# Patient Record
Sex: Male | Born: 1950 | Race: Black or African American | Hispanic: No | Marital: Married | Smoking: Never smoker
Health system: Southern US, Community
[De-identification: ages and names within clinical notes are randomized; demographics above are authoritative.]

## PROBLEM LIST (undated history)

## (undated) ENCOUNTER — Inpatient Hospital Stay
Discharge: 2014-02-11 | Disposition: A | Discharge status: Home / Self Care | Payer: Auto Insurance (includes no fault) | Attending: Emergency Medicine

## (undated) DIAGNOSIS — M1990 Unspecified osteoarthritis, unspecified site: Secondary | ICD-10-CM

---

## 2012-07-03 ENCOUNTER — Emergency Department (HOSPITAL_BASED_OUTPATIENT_CLINIC_OR_DEPARTMENT_OTHER)
Admission: EM | Admit: 2012-07-03 | Discharge: 2012-07-03 | Disposition: A | Discharge status: Home / Self Care | Payer: Self-pay | Attending: Emergency Medicine | Admitting: Emergency Medicine

## 2012-07-03 ENCOUNTER — Encounter (HOSPITAL_BASED_OUTPATIENT_CLINIC_OR_DEPARTMENT_OTHER): Payer: Self-pay

## 2012-07-03 ENCOUNTER — Emergency Department (HOSPITAL_BASED_OUTPATIENT_CLINIC_OR_DEPARTMENT_OTHER): Payer: Self-pay

## 2012-07-03 DIAGNOSIS — M79609 Pain in unspecified limb: Secondary | ICD-10-CM | POA: Insufficient documentation

## 2012-07-03 DIAGNOSIS — M79673 Pain in unspecified foot: Secondary | ICD-10-CM

## 2012-07-03 MED ORDER — TRAMADOL HCL 50 MG PO TABS: 50.0000 mg | ORAL_TABLET | Freq: Four times a day (QID) | ORAL | Status: DC | PRN

## 2012-07-03 NOTE — ED Notes (Signed)
Pt reports right foot pain x 1 week.

## 2012-07-03 NOTE — ED Provider Notes (Signed)
History     CSN: [id number]  Arrival date & time 07/03/12  1003   First MD Initiated Contact with Patient 07/03/12 1029      Chief Complaint  Patient presents with  . Foot Pain    (Consider location/radiation/quality/duration/timing/severity/associated sxs/prior treatment) HPI Comments: Patient presents with a one-week history of worsening pain to his right foot. He denies any known injury but does say that he walks on it frequently. Denies any past injuries or problems with his feet or other joints. He's been taking over-the-counter medicines without relief. He states it does get swollen at times but then comes down he rested. Denies any rashes or wounds to the area. He describes it as a constant throbbing worsening pain over the last week.   History reviewed. No pertinent past medical history.  History reviewed. No pertinent past surgical history.  No family history on file.  History  Substance Use Topics  . Smoking status: Never Smoker   . Smokeless tobacco: Not on file  . Alcohol Use: No      Review of Systems  Constitutional: Negative for fever.  HENT: Negative for neck pain.   Respiratory: Negative.   Cardiovascular: Negative.   Gastrointestinal: Negative for nausea and vomiting.  Musculoskeletal: Positive for joint swelling. Negative for back pain.  Skin: Negative for wound.  Neurological: Negative for headaches.    Allergies  Review of patient's allergies indicates no known allergies.  Home Medications   Current Outpatient Rx  Name  Route  Sig  Dispense  Refill  . TRAMADOL HCL 50 MG PO TABS   Oral   Take 1 tablet (50 mg total) by mouth every 6 (six) hours as needed for pain.   15 tablet   0     BP 148/82  Pulse 87  Temp 97.7 F (36.5 C) (Oral)  Resp 16  Ht 6\' 2"  (1.88 m)  Wt 202 lb (91.627 kg)  BMI 25.94 kg/m2  SpO2 100%  Physical Exam  Constitutional: He is oriented to person, place, and time. He appears well-developed and well-nourished.   HENT:  Head: Normocephalic and atraumatic.  Neck: Normal range of motion. Neck supple.       No pain to neck or back  Cardiovascular: Normal rate.   Pulmonary/Chest: Effort normal.  Musculoskeletal: He exhibits tenderness. He exhibits no edema.       Patient is tenderness to palpation over the right foot along the first metatarsal. There's no swelling or deformity. There's no warmth or erythema. Pulses are intact and capillary refill is less than 2 distally. He has normal sensation and motor function in the foot.  Neurological: He is alert and oriented to person, place, and time.    ED Course  Procedures (including critical care time)  Labs Reviewed - No data to display Dg Foot Complete Right  07/03/2012  *RADIOLOGY REPORT*  Clinical Data: Right foot pain  RIGHT FOOT COMPLETE - 3+ VIEW  Comparison: None.  Findings: Mild hallux valgus deformity is seen.  No acute fracture or dislocation is noted.  No soft tissue abnormality is seen.  IMPRESSION: Mild degenerative change without acute abnormality.   Original Report Authenticated By: Alcide Clever, M.D.      1. Foot pain       MDM  Patient with likely some arthritic pain in his foot. Possibly from overuse. I don't see any evidence of infection or cellulitis. Patient was placed in a walking shoe and will be given a prescription for Ultram  for pain. I did give him a referral to orthopedics to followup with his pain is not improving as he might need a further evaluation for a stress fracture that was not evident on the initial x-rays.        Rolan Bucco, MD 07/03/12 1123

## 2013-03-13 ENCOUNTER — Emergency Department (HOSPITAL_BASED_OUTPATIENT_CLINIC_OR_DEPARTMENT_OTHER)
Admission: EM | Admit: 2013-03-13 | Discharge: 2013-03-13 | Disposition: A | Discharge status: Home / Self Care | Payer: Self-pay | Attending: Emergency Medicine | Admitting: Emergency Medicine

## 2013-03-13 ENCOUNTER — Encounter (HOSPITAL_BASED_OUTPATIENT_CLINIC_OR_DEPARTMENT_OTHER): Payer: Self-pay | Admitting: Emergency Medicine

## 2013-03-13 DIAGNOSIS — IMO0002 Reserved for concepts with insufficient information to code with codable children: Secondary | ICD-10-CM | POA: Insufficient documentation

## 2013-03-13 DIAGNOSIS — S90822A Blister (nonthermal), left foot, initial encounter: Secondary | ICD-10-CM

## 2013-03-13 DIAGNOSIS — X58XXXA Exposure to other specified factors, initial encounter: Secondary | ICD-10-CM | POA: Insufficient documentation

## 2013-03-13 DIAGNOSIS — L0390 Cellulitis, unspecified: Secondary | ICD-10-CM

## 2013-03-13 DIAGNOSIS — Y939 Activity, unspecified: Secondary | ICD-10-CM | POA: Insufficient documentation

## 2013-03-13 DIAGNOSIS — L02619 Cutaneous abscess of unspecified foot: Secondary | ICD-10-CM | POA: Insufficient documentation

## 2013-03-13 DIAGNOSIS — Y929 Unspecified place or not applicable: Secondary | ICD-10-CM | POA: Insufficient documentation

## 2013-03-13 MED ORDER — CEPHALEXIN 500 MG PO CAPS
500.0000 mg | ORAL_CAPSULE | Freq: Four times a day (QID) | ORAL | Status: DC
Start: 2013-03-13 — End: 2013-05-26

## 2013-03-13 MED ORDER — SULFAMETHOXAZOLE-TRIMETHOPRIM 800-160 MG PO TABS: 1.0000 | ORAL_TABLET | Freq: Two times a day (BID) | ORAL | Status: DC

## 2013-03-13 NOTE — ED Provider Notes (Signed)
CSN: [id number]     Arrival date & time 03/13/13  1303 History   First MD Initiated Contact with Patient 03/13/13 1315     Chief Complaint  Patient presents with  . Blister    L foot   (Consider location/radiation/quality/duration/timing/severity/associated sxs/prior Treatment) HPI Comments: Pt states that he developed a blister to the lateral aspect of his left foot yesterday:denies history of similar symptoms:states that the area is burning:denies discharge:denies being diabetic:last tetanus shot was last year:denies any injury to the area or new shoes  The history is provided by the patient. No language interpreter was used.    History reviewed. No pertinent past medical history. History reviewed. No pertinent past surgical history. History reviewed. No pertinent family history. History  Substance Use Topics  . Smoking status: Never Smoker   . Smokeless tobacco: Not on file  . Alcohol Use: No    Review of Systems  Constitutional: Negative.   Respiratory: Negative.   Cardiovascular: Negative.     Allergies  Review of patient's allergies indicates no known allergies.  Home Medications   Current Outpatient Rx  Name  Route  Sig  Dispense  Refill  . cephALEXin (KEFLEX) 500 MG capsule   Oral   Take 1 capsule (500 mg total) by mouth 4 (four) times daily.   28 capsule   0   . sulfamethoxazole-trimethoprim (SEPTRA DS) 800-160 MG per tablet   Oral   Take 1 tablet by mouth every 12 (twelve) hours.   14 tablet   0   . traMADol (ULTRAM) 50 MG tablet   Oral   Take 1 tablet (50 mg total) by mouth every 6 (six) hours as needed for pain.   15 tablet   0    BP 146/80  Pulse 75  Temp(Src) 98.2 F (36.8 C) (Oral)  Resp 16  Ht 6\' 2"  (1.88 m)  Wt 215 lb (97.523 kg)  BMI 27.59 kg/m2  SpO2 95% Physical Exam  Nursing note and vitals reviewed. Constitutional: He is oriented to person, place, and time. He appears well-developed and well-nourished.  Cardiovascular: Normal  rate and regular rhythm.   Pulmonary/Chest: Effort normal and breath sounds normal.  Musculoskeletal: Normal range of motion.  Neurological: He is alert and oriented to person, place, and time.  Skin:  Pt has fluctuant circular area to medial aspect of the left foot:with mild redness to the area    ED Course  INCISION AND DRAINAGE Date/Time: 03/13/2013 1:48 PM Performed by: Teressa Lower Authorized by: Teressa Lower Consent: Verbal consent obtained. Risks and benefits: risks, benefits and alternatives were discussed Consent given by: patient Patient identity confirmed: verbally with patient Time out: Immediately prior to procedure a "time out" was called to verify the correct patient, procedure, equipment, support staff and site/side marked as required. Type: abscess Body area: lower extremity Location details: left foot Anesthesia: local infiltration Local anesthetic: lidocaine 2% without epinephrine Incision type: single straight Drainage: serous Drainage amount: scant Patient tolerance: Patient tolerated the procedure well with no immediate complications.   (including critical care time) Labs Review Labs Reviewed  GLUCOSE, CAPILLARY   Imaging Review No results found.  MDM   1. Blister of foot, left, initial encounter   2. Cellulitis    Drained:pt is not diabetic:pt told to return here or wound clinic for worsening symptoms   Teressa Lower, NP 03/13/13 1349

## 2013-03-13 NOTE — ED Notes (Signed)
Pt reports "blister" on L (medial/distal) foot also c/o "pain with walking and burning"

## 2013-03-13 NOTE — ED Provider Notes (Signed)
Medical screening examination/treatment/procedure(s) were conducted as a shared visit with non-physician practitioner(s) and myself.  I personally evaluated the patient during the encounter.  Patient is a 62 year old male with no significant past medical history who presents with a "blister" to the side of his head for 2 days. He has a 4 x 5 cm fluctuant area to the medial aspect of his left foot with mild surrounding cellulitis. No history of injury to this foot. No recent change in his shoes. He is wearing tennis shoes. He is nondiabetic. No history of neuropathy. Neurovascular intact distally. Will I&D as I am concerned this may be an abscess. We'll discharge home on antibiotics for his surrounding cellulitis.  Layla Maw Allysha Tryon, DO 03/13/13 1435

## 2013-05-26 ENCOUNTER — Emergency Department (HOSPITAL_BASED_OUTPATIENT_CLINIC_OR_DEPARTMENT_OTHER)
Admission: EM | Admit: 2013-05-26 | Discharge: 2013-05-26 | Disposition: A | Discharge status: Home / Self Care | Payer: No Typology Code available for payment source | Attending: Emergency Medicine | Admitting: Emergency Medicine

## 2013-05-26 ENCOUNTER — Encounter (HOSPITAL_BASED_OUTPATIENT_CLINIC_OR_DEPARTMENT_OTHER): Payer: Self-pay | Admitting: Emergency Medicine

## 2013-05-26 DIAGNOSIS — IMO0002 Reserved for concepts with insufficient information to code with codable children: Secondary | ICD-10-CM | POA: Insufficient documentation

## 2013-05-26 DIAGNOSIS — Z792 Long term (current) use of antibiotics: Secondary | ICD-10-CM | POA: Insufficient documentation

## 2013-05-26 DIAGNOSIS — Y92410 Unspecified street and highway as the place of occurrence of the external cause: Secondary | ICD-10-CM | POA: Insufficient documentation

## 2013-05-26 DIAGNOSIS — Y9389 Activity, other specified: Secondary | ICD-10-CM | POA: Insufficient documentation

## 2013-05-26 DIAGNOSIS — S161XXA Strain of muscle, fascia and tendon at neck level, initial encounter: Secondary | ICD-10-CM

## 2013-05-26 DIAGNOSIS — S139XXA Sprain of joints and ligaments of unspecified parts of neck, initial encounter: Secondary | ICD-10-CM | POA: Insufficient documentation

## 2013-05-26 MED ORDER — IBUPROFEN 800 MG PO TABS: 800.0000 mg | ORAL_TABLET | Freq: Three times a day (TID) | ORAL | Status: DC | PRN

## 2013-05-26 MED ORDER — IBUPROFEN 800 MG PO TABS
800.0000 mg | ORAL_TABLET | Freq: Once | ORAL | Status: AC
  Administered 2013-05-26: 800 mg via ORAL
  Filled 2013-05-26: qty 1

## 2013-05-26 MED ORDER — CYCLOBENZAPRINE HCL 10 MG PO TABS: 10.0000 mg | ORAL_TABLET | Freq: Three times a day (TID) | ORAL | Status: DC | PRN
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
## 2014-02-11 MED ORDER — NAPROXEN 375 MG TAB
375 mg | ORAL_TABLET | Freq: Two times a day (BID) | ORAL | Status: AC
Start: 2014-02-11 — End: ?

## 2014-02-11 MED ORDER — IBUPROFEN 800 MG TAB
800 mg | ORAL | Status: AC
Start: 2014-02-11 — End: 2014-02-11
  Administered 2014-02-11: 15:00:00 via ORAL

## 2014-02-11 MED ORDER — HYDROCODONE-ACETAMINOPHEN 5 MG-325 MG TAB
5-325 mg | ORAL | Status: AC
Start: 2014-02-11 — End: 2014-02-11
  Administered 2014-02-11: 15:00:00 via ORAL

## 2014-02-11 MED ORDER — CYCLOBENZAPRINE 10 MG TAB
10 mg | ORAL | Status: AC
Start: 2014-02-11 — End: 2014-02-11
  Administered 2014-02-11: 15:00:00 via ORAL

## 2014-02-11 MED ORDER — CYCLOBENZAPRINE 5 MG TAB
5 mg | ORAL_TABLET | Freq: Three times a day (TID) | ORAL | Status: AC | PRN
Start: 2014-02-11 — End: ?

## 2014-02-11 MED FILL — HYDROCODONE-ACETAMINOPHEN 5 MG-325 MG TAB: 5-325 mg | ORAL | Qty: 1

## 2014-02-11 MED FILL — CYCLOBENZAPRINE 10 MG TAB: 10 mg | ORAL | Qty: 1

## 2014-02-11 MED FILL — IBUPROFEN 800 MG TAB: 800 mg | ORAL | Qty: 1
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
## 2014-02-11 NOTE — ED Notes (Signed)
I have reviewed discharge instructions with the patient.  The patient verbalized understanding. Patient is in no acute distress at this time. Patient is waiting with wife within ER at this time. Patient verbalizes that someone will be driving them home. Patient has discharge instructions and prescriptions in hand at this time.

## 2015-04-21 ENCOUNTER — Encounter (HOSPITAL_BASED_OUTPATIENT_CLINIC_OR_DEPARTMENT_OTHER): Payer: Self-pay | Admitting: *Deleted

## 2015-04-21 ENCOUNTER — Emergency Department (HOSPITAL_BASED_OUTPATIENT_CLINIC_OR_DEPARTMENT_OTHER): Payer: Medicare Other

## 2015-04-21 ENCOUNTER — Emergency Department (HOSPITAL_BASED_OUTPATIENT_CLINIC_OR_DEPARTMENT_OTHER)
Admission: EM | Admit: 2015-04-21 | Discharge: 2015-04-21 | Disposition: A | Discharge status: Home / Self Care | Payer: Medicare Other | Attending: Emergency Medicine | Admitting: Emergency Medicine

## 2015-04-21 DIAGNOSIS — M25561 Pain in right knee: Secondary | ICD-10-CM | POA: Insufficient documentation

## 2015-04-21 MED ORDER — ACETAMINOPHEN 500 MG PO TABS
1000.0000 mg | ORAL_TABLET | Freq: Once | ORAL | Status: AC
  Administered 2015-04-21: 1000 mg via ORAL

## 2015-04-21 MED ORDER — ACETAMINOPHEN 500 MG PO TABS
1000.0000 mg | ORAL_TABLET | Freq: Once | ORAL | Status: DC
  Filled 2015-04-21: qty 2

## 2015-04-21 NOTE — ED Provider Notes (Signed)
CSN: [id number]     Arrival date & time 04/21/15  1517 History   First MD Initiated Contact with Patient 04/21/15 1536     Chief Complaint  Patient presents with  . Knee Pain     (Consider location/radiation/quality/duration/timing/severity/associated sxs/prior Treatment) HPI   Ian Watson is a 64 y.o. male  with no major medical problems presents to the Emergency Department complaining of gradual persistent medial right knee pain onset 2 weeks ago.  Patient denies falls or known trauma. He reports pain only to the lower medial portion of the right knee. He reports he is able to walk though it is painful. He denies redness, swelling, fevers, chills. He denies a history of gout or septic joint. He denies IV drug use. He denies anticoagulant usage.  Patient reports he has not tried anything for the pain. He does not have a primary care physician. Nothing makes the symptoms better and walking makes them worse.  Pt denies fever, chills, numbness, tingling, weakness.     History reviewed. No pertinent past medical history. History reviewed. No pertinent past surgical history. No family history on file. Social History  Substance Use Topics  . Smoking status: Never Smoker   . Smokeless tobacco: None  . Alcohol Use: No    Review of Systems  Constitutional: Negative for fever and chills.  Gastrointestinal: Negative for nausea and vomiting.  Musculoskeletal: Positive for arthralgias ( Right knee). Negative for back pain, joint swelling, neck pain and neck stiffness.  Skin: Negative for wound.  Neurological: Negative for numbness.  Hematological: Does not bruise/bleed easily.  Psychiatric/Behavioral: The patient is not nervous/anxious.   All other systems reviewed and are negative.     Allergies  Review of patient's allergies indicates no known allergies.  Home Medications   Prior to Admission medications   Medication Sig Start Date End Date Taking? Authorizing Provider   cyclobenzaprine (FLEXERIL) 10 MG tablet Take 1 tablet (10 mg total) by mouth 3 (three) times daily as needed for muscle spasms. 05/26/13   Susy Frizzleharles Sheldon, MD  ibuprofen (ADVIL,MOTRIN) 800 MG tablet Take 1 tablet (800 mg total) by mouth every 8 (eight) hours as needed. 05/26/13   Susy Frizzleharles Sheldon, MD   BP 128/76 mmHg  Pulse 60  Temp(Src) 97.9 F (36.6 C) (Oral)  Resp 18  Ht 6\' 2"  (1.88 m)  Wt 210 lb (95.255 kg)  BMI 26.95 kg/m2  SpO2 98% Physical Exam  Constitutional: He appears well-developed and well-nourished. No distress.  HENT:  Head: Normocephalic and atraumatic.  Eyes: Conjunctivae are normal.  Neck: Normal range of motion.  Cardiovascular: Normal rate, regular rhythm, normal heart sounds and intact distal pulses.   Capillary refill < 3 sec  Pulmonary/Chest: Effort normal and breath sounds normal.  Musculoskeletal: He exhibits tenderness. He exhibits no edema.  ROM: Full range of motion of right ankle, knee and hip No joint effusion of the right knee No swelling, erythema, increased warmth or ecchymosis No abnormal patellar movement No evidence of patellar tendon disruption Tenderness to palpation along the medial joint line but no tenderness along the lateral joint line or popliteal space  Neurological: He is alert. Coordination normal.  Sensation intact to dull and sharp in the right lower extremity Strength 5/5 including flexion-extension of the knee, dorsiflexion and plantar flexion of the ankle and flexion, extension, abduction and adduction of the hip Patient ambulates with antalgic gait but is able to weight-bear on the knee without difficulty. No foot drop or dragging of the  leg  Skin: Skin is warm and dry. He is not diaphoretic.  No tenting of the skin  Psychiatric: He has a normal mood and affect.  Nursing note and vitals reviewed.   ED Course  Procedures (including critical care time) Labs Review Labs Reviewed - No data to display  Imaging Review Dg  Knee Complete 4 Views Right  04/21/2015  CLINICAL DATA:  Medial knee pain for 1 week.  No known injury. EXAM: RIGHT KNEE - COMPLETE 4+ VIEW COMPARISON:  None. 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 No acute bony abnormality. Specifically, no fracture, subluxation, or dislocation. Soft tissues are intact. Mild chondrocalcinosis. IMPRESSION: No acute bony abnormality. Electronically Signed   By: Charlett Nose M.D.   On: 04/21/2015 16:08   I have personally reviewed and evaluated these images and lab results as part of my medical decision-making.   EKG Interpretation None      MDM   Final diagnoses:  Arthralgia of right knee   [name] presents with nonstenotic right knee pain.  Patient X-Ray negative for obvious fracture or dislocation. 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 These were discussed with patient and wife. Pain managed in ED. Pt advised to follow up with orthopedics for further evaluation of pain and bony infarcts.  Patient has no known history of sickle cell disease. Patient given brace while in ED, conservative therapy recommended and discussed. Patient will be dc home & is agreeable with above plan.   BP 128/76 mmHg  Pulse 60  Temp(Src) 97.9 F (36.6 C) (Oral)  Resp 18  Ht  (1.88 m)  Wt 210 lb (95.255 kg)  BMI 26.95 kg/m2  SpO2 98%  The patient was discussed with and seen by Dr. Cyndie Chime who agrees with the treatment plan.   Dahlia Client Kiearra Oyervides, PA-C 04/21/15 1700  Leta Baptist, MD 04/22/15 [phone]

## 2015-04-21 NOTE — ED Notes (Signed)
Right knee pain. No injury. Pain for a week.

## 2015-04-21 NOTE — ED Notes (Signed)
Pt states immediate relief with knee sleeve applied.

## 2015-04-21 NOTE — ED Notes (Signed)
Patient transported to X-ray 

## 2015-04-21 NOTE — Discharge Instructions (Signed)
1. Medications: alternate naprosyn and tylenol for pain control, usual home medications 2. Treatment: rest, ice, elevate and use brace, drink plenty of fluids, gentle stretching 3. Follow Up: Please followup with orthopedics as directed or your PCP in 1 week if no improvement for discussion of your diagnoses and further evaluation after today's visit; if you do not have a primary care doctor use the resource guide provided to find one; Please return to the ER for worsening symptoms or other concerns    Joint Pain Joint pain, which is also called arthralgia, can be caused by many things. Joint pain often goes away when you follow your health care provider's instructions for relieving pain at home. However, joint pain can also be caused by conditions that require further treatment. Common causes of joint pain include:  Bruising in the area of the joint.  Overuse of the joint.  Wear and tear on the joints that occur with aging (osteoarthritis).  Various other forms of arthritis.  A buildup of a crystal form of uric acid in the joint (gout).  Infections of the joint (septic arthritis) or of the bone (osteomyelitis). Your health care provider may recommend medicine to help with the pain. If your joint pain continues, additional tests may be needed to diagnose your condition. HOME CARE INSTRUCTIONS Watch your condition for any changes. Follow these instructions as directed to lessen the pain that you are feeling.  Take medicines only as directed by your health care provider.  Rest the affected area for as long as your health care provider says that you should. If directed to do so, raise the painful joint above the level of your heart while you are sitting or lying down.  Do not do things that cause or worsen pain.  If directed, apply ice to the painful area:  Put ice in a plastic bag.  Place a towel between your skin and the bag.  Leave the ice on for 20 minutes, 2-3 times per  day.  Wear an elastic bandage, splint, or sling as directed by your health care provider. Loosen the elastic bandage or splint if your fingers or toes become numb and tingle, or if they turn cold and blue.  Begin exercising or stretching the affected area as directed by your health care provider. Ask your health care provider what types of exercise are safe for you.  Keep all follow-up visits as directed by your health care provider. This is important. SEEK MEDICAL CARE IF:  Your pain increases, and medicine does not help.  Your joint pain does not improve within 3 days.  You have increased bruising or swelling.  You have a fever.  You lose 10 lb (4.5 kg) or more without trying. SEEK IMMEDIATE MEDICAL CARE IF:  You are not able to move the joint.  Your fingers or toes become numb or they turn cold and blue.   This information is not intended to replace advice given to you by your health care provider. Make sure you discuss any questions you have with your health care provider.   Document Released: 05/30/2005 Document Revised: 06/20/2014 Document Reviewed: 03/11/2014 Elsevier Interactive Patient Education 2016 [employer].    Emergency Department Resource Guide 1) Find a Doctor and Pay Out of Pocket Although you won't have to find out who is covered by your insurance plan, it is a good idea to ask around and get recommendations. You will then need to call the office and see if the doctor you have  chosen will accept you as a new patient and what types of options they offer for patients who are self-pay. Some doctors offer discounts or will set up payment plans for their patients who do not have insurance, but you will need to ask so you aren't surprised when you get to your appointment.  2) Contact Your Local Health Department Not all health departments have doctors that can see patients for sick visits, but many do, so it is worth a call to see if yours does. If you don't know  where your local health department is, you can check in your phone book. The CDC also has a tool to help you locate your state's health department, and many state websites also have listings of all of their local health departments.  3) Find a Walk-in Clinic If your illness is not likely to be very severe or complicated, you may want to try a walk in clinic. These are popping up all over the country in pharmacies, drugstores, and shopping centers. They're usually staffed by nurse practitioners or physician assistants that have been trained to treat common illnesses and complaints. They're usually fairly quick and inexpensive. However, if you have serious medical issues or chronic medical problems, these are probably not your best option.  No Primary Care Doctor: - Call Health Connect at  [phone] - they can help you locate a primary care doctor that  accepts your insurance, provides certain services, etc. - Physician Referral Service- [phone]  Chronic Pain Problems: Organization         Address  Phone   Notes  [facility]  [phone] Patients need to be referred by their primary care doctor.   Medication Assistance: Organization         Address  Phone   Notes  [facility] Medication [facility] [address] [place], [place] [address] [phone] --Must be a resident of [facility] [place] -- Must have NO insurance coverage whatsoever (no Medicaid/ Medicare, etc.) -- The pt. MUST have a primary care doctor that directs their care regularly and follows them in the community   MedAssist  [phone]   [employer]  [phone]    Agencies that provide inexpensive medical care: Organization         Address  Phone   Notes  [facility]  [phone]   [facility]    [phone]   [facility] [address] [place], [place] [address] [phone]) [phone]   [facility]  [address], [place] [phone]   Planned Parenthood    [phone]   [facility]    [phone]   [facility]  [address], [place] Phone:  [phone], Fax:  [phone] Hours of Operation:  9 am - 6 pm, M-F.  Also accepts Medicaid/Medicare and self-pay.  [facility]  [address], [place] Phone: [phone]) [phone], Fax: [phone]. Hours of Operation:  8:30 am - 5:30 pm, M-F.  Also accepts Medicaid and self-pay.  [facility] [place] [address], [place] Phone: [phone]) [phone]   [facility] [address], [place], [place] [phone], Ext. [phone] Mondays & Thursdays: 7-9 AM.  First 15 patients are seen on a first come, first serve basis.    Medicaid-accepting [facility] Providers:  Organization  Address  Phone   Notes  [facility] [address], [place] [phone] Also accepts self-pay patients.  [facility] [address], [place]  [phone]) [phone]   [facility] [address], [place] [phone]   [facility] [address] [place] [address], [place] [phone]) [phone]   Renaye RakersVeita Bland [address], [place]   [phone] Only accepts [place] Access [place] patients after they have their name applied to their card.   Self-Pay (no insurance) in [facility] [place]:  Organization         Address  Phone   Notes  Sickle Cell Patients, [facility] [address], [place] [phone]   [facility] [address], [place] [phone]   [facility] [place]  [address], [place] [phone]   [facility]/Dr. Osei-Bonsu  [address] [place] [address], [place] or [id number], [place] [phone] Phone number for both  [place] and [place] locations is the same.  [facility] [address], [place] [phone]   [facility] [place] [facility] [address] [place] [address], [place] or [address] [phone]) [phone]   [facility] [address], [place] [phone]) [phone], phone; [phone], fax Sees patients 1st and 3rd Saturday of every month.  Must not qualify for public or private insurance (i.e. Medicaid, Medicare, [place] Health Choice, Veterans' Benefits)  Household income should be no more than 200% of the poverty level The clinic cannot treat you if you are pregnant or think you are pregnant  Sexually transmitted diseases are not treated at the clinic.    Dental Care: Organization         Address  Phone  Notes  [facility] Department of [facility] [address], [place] [phone]) [phone] Accepts children up to age 65 who are enrolled in [place] or [place] Health Choice; pregnant women with a Medicaid card; and children who have applied for Medicaid or [place] Health Choice, but were declined, whose parents can pay a reduced fee at time of service.  [facility] [place]  [address], [place] [phone] Accepts children up to age 65 who are enrolled in [place] or [place] Health Choice; pregnant women with a Medicaid card; and children who have applied for Medicaid or [place] Health Choice, but were declined, whose parents can pay a reduced fee at time of service.  [facility]  [address], [place] [phone] Patients are seen by appointment only. Walk-ins are not accepted. [facility] will see patients 65 years of age and older. Monday - Tuesday (8am-5pm) Most Wednesdays (8:30-5pm) $30 per visit, cash only  [facility]  [address], [facility] [place] [facility] [phone] Patients are seen by appointment only. Walk-ins are not  accepted. [facility] will see patients 65 years of age and older. One Wednesday Evening (Monthly: Volunteer Based).  $30 per visit, cash only  [facility]  [phone]) [phone] for adults; Children under age 214, call Graduate Pediatric Dentistry at [phone]. Children aged 714-14, please call [phone] to request a pediatric application.  Dental services are provided in all areas of dental care including  fillings, crowns and bridges, complete and partial dentures, implants, gum treatment, root canals, and extractions. Preventive care is also provided. Treatment is provided to both adults and children. Patients are selected via a lottery and there is often a waiting list.   [facility] [address] [place]  [phone] [email or web address]   [facility] [address] [place], [place] [phone], Ext. [phone] Second and Fourth Thursday of each month, opens at 6:30 AM; Clinic ends at 9 AM.  Patients are seen on a first-come first-served basis, and a limited number are seen during each clinic.   [facility]  [address] Ether Griffins [place], [place] [phone]   Eligibility Requirements You must have lived in [place], [place], or [place] counties for at least the last three months.   You cannot be eligible for state or federal sponsored [place], including [employer], [place], or [employer].   You generally cannot be eligible for healthcare insurance through your employer.    How to apply: Eligibility screenings are held every Tuesday and Wednesday afternoon from 1:00 pm until 4:00 pm. You do not need an appointment for the interview!  [facility] [address], [place], [place] [phone]   [facility] Health Department  [phone]   [facility] Health Department  [phone]   [facility] Health Department  [phone]    Behavioral Health Resources in the  Community: Intensive Outpatient Programs Organization         Address  Phone  Notes  [facility] [address] [place], [place] [phone]   [facility] Outpatient [address], [place], [place] [phone]   [facility] [address] [place], [place]  [phone]   [facility] [address]  [place], [place] [phone] or [phone]   Substance Abuse Resources Organization         Address  Phone  Notes  [facility] Drug [facility]  3852061205   [facility]  [phone]   [facility] [place]  [phone]   Floydene Flock  [phone]   Residential & Outpatient Substance Abuse Program  [phone]   Psychological Services Organization         Address  Phone  Notes  [facility] Health  [phone]   [facility]  [phone]   [facility] Mental Health [address] [place] [phone] or [phone]    Mobile Crisis Teams Organization         Address  Phone  Notes  [facility], Mobile Crisis Care Unit  [phone]   [facility]  [address] [place], [place] [phone]   Doristine Locks [address] [place] [phone]    Self-Help/Support Groups Organization         Address  Phone             Notes  [facility] [place] - variety of support groups  [phone]- I7437963 Call for more information  Narcotics Anonymous (NA), [facility] [address], [employer] [place]  2 meetings at this location   Statistician         Address  Phone  Notes  [facility] [address],    [place]  [phone]   [facility]  [address] [place] [address], [place], [place] [phone]   [facility] [address] [place], [place] [phone] Admissions: 8am-3pm M-F  [facility] [address]    [place], [place] [phone]   [facility] [address] [place], [place], [place] [phone]   [facility] [address],  [place], [place] [phone]   [facility] - Intensive Outpatient [address], [place], [place] [phone]   [facility] ([facility] [address] [place].,  [place], [place] [phone] or [phone]   [facility] ([facility]) [address], [place], [place] [phone] Accepts Medicaid  [facility] [address]  [place] [phone] Substance Abuse/Addiction Treatment   [facility] Organization         Address  Phone  Notes  [facility]  3806693626   Angie Fava, PhD [address]vin Knack [place], [place]   [phone] or [phone]   [facility] Behavioral   [address] [place], [place] [phone]   [facility] [address], [place], [place] [phone] Insurance/Medicaid/sponsorship through [facility] and Families [address]                                    [place], [place] [phone] Therapy/tele-psych/case  [facility] [address], [place] [phone]    Dr. Lolly Mustache  [phone]   [facility] [place]  [employer] [facility] Dept. 1) [address] [place] 2) [address], [place] 3)  [address], [place] [phone]  [phone]   [facility] Child Abuse Hotline (760)143-6894 or [phone] (After Hours)

## 2016-05-16 ENCOUNTER — Emergency Department (HOSPITAL_BASED_OUTPATIENT_CLINIC_OR_DEPARTMENT_OTHER): Payer: Medicare Other

## 2016-05-16 ENCOUNTER — Encounter (HOSPITAL_BASED_OUTPATIENT_CLINIC_OR_DEPARTMENT_OTHER): Payer: Self-pay | Admitting: *Deleted

## 2016-05-16 ENCOUNTER — Emergency Department (HOSPITAL_BASED_OUTPATIENT_CLINIC_OR_DEPARTMENT_OTHER)
Admission: EM | Admit: 2016-05-16 | Discharge: 2016-05-16 | Disposition: A | Discharge status: Home / Self Care | Payer: Medicare Other | Attending: Emergency Medicine | Admitting: Emergency Medicine

## 2016-05-16 DIAGNOSIS — M25461 Effusion, right knee: Secondary | ICD-10-CM | POA: Diagnosis not present

## 2016-05-16 DIAGNOSIS — M25561 Pain in right knee: Secondary | ICD-10-CM | POA: Diagnosis present

## 2016-05-16 HISTORY — DX: Unspecified osteoarthritis, unspecified site: M19.90

## 2016-05-16 MED ORDER — PENTAFLUOROPROP-TETRAFLUOROETH EX AERO
INHALATION_SPRAY | CUTANEOUS | Status: AC
  Filled 2016-05-16: qty 30

## 2016-05-16 MED ORDER — LIDOCAINE HCL 2 % IJ SOLN
INTRAMUSCULAR | Status: AC
  Administered 2016-05-16: 400 mg
  Filled 2016-05-16: qty 20

## 2016-05-16 MED ORDER — LIDOCAINE-EPINEPHRINE (PF) 2 %-1:200000 IJ SOLN: 10.0000 mL | Freq: Once | INTRAMUSCULAR | Status: DC

## 2016-05-16 MED ORDER — HYDROCODONE-ACETAMINOPHEN 5-325 MG PO TABS: 2.0000 | ORAL_TABLET | Freq: Four times a day (QID) | ORAL | 0 refills | Status: DC | PRN

## 2016-05-16 NOTE — ED Provider Notes (Signed)
[facility] Provider Note   CSN: [id number] Arrival date & time: 05/16/16  1537 By signing my name below, I, [name], attest that this documentation has been prepared under the direction and in the presence of Fayrene HelperBowie Huan Pollok, PA-C. Electronically Signed: [name], ED Scribe. 05/16/16. 6:02 PM.  History   Chief Complaint Chief Complaint  Patient presents with  . Knee Pain   The history is provided by the patient. No language interpreter was used.   HPI Comments: Ian Watson is a 65 y.o. male who presents to the Emergency Department with a PMHx of arthritis complaining of throbbing right knee pain with associated swelling that began 3 days ago. Pt states he was outside raking leaves when he gradually began having pain. His pain radiates to his right thigh. He states his muscles have been tight making it difficult for him to bend his knee. Pt took left over Tylenol with codeine which provided mild releif. Pt denies any hip pain, ankle pain, fevers, chills, CP, SOB, N/V/D or any other symptoms at this time. 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 Pt also report having his R knee aspirated in the past which has helped.   Pt walks with a cane and was driven her today by his wife.  Past Medical History:  Diagnosis Date  . Arthritis     There are no active problems to display for this patient.   History reviewed. No pertinent surgical history.   Home Medications    Prior to Admission medications   Medication Sig Start Date End Date Taking? Authorizing Provider  cyclobenzaprine (FLEXERIL) 10 MG tablet Take 1 tablet (10 mg total) by mouth 3 (three) times daily as needed for muscle spasms. 05/26/13   Susy Frizzleharles Sheldon, MD  ibuprofen (ADVIL,MOTRIN) 800 MG tablet Take 1 tablet (800 mg total) by mouth every 8 (eight) hours as needed. 05/26/13   Susy Frizzleharles Sheldon, MD    Family History No family history on file.  Social  History Social History  Substance Use Topics  . Smoking status: Never Smoker  . Smokeless tobacco: Never Used  . Alcohol use No     Allergies   Patient has no known allergies.   Review of Systems Review of Systems  Constitutional: Negative for chills and fever.  Respiratory: Negative for shortness of breath.   Cardiovascular: Negative for chest pain.  Gastrointestinal: Negative for diarrhea, nausea and vomiting.  Musculoskeletal: Positive for arthralgias and joint swelling.   Physical Exam Updated Vital Signs BP 156/88   Pulse 93   Temp 98 F (36.7 C) (Oral)   Resp 20   Ht 6\' 2"  (1.88 m)   Wt 241 lb (109.3 kg)   SpO2 96%   BMI 30.94 kg/m   Physical Exam  Constitutional: He is oriented to person, place, and time. He appears well-developed and well-nourished.  HENT:  Head: Normocephalic.  Eyes: Conjunctivae and EOM are normal.  Neck: Normal range of motion.  Cardiovascular: Normal rate.   Pulmonary/Chest: Effort normal.  Abdominal: He exhibits no distension.  Musculoskeletal: Normal range of motion.  TTP of right superior patellar region with associated effusion noted. No surrounding skin erythema, warmth or deformities. Patella is locatable. Normal knee flexion and extension. DTR intact. Pt is neurovascularly intact.   Neurological: He is alert and oriented to person, place, and time. He displays normal reflexes. No cranial nerve deficit.  Skin: Skin is warm and dry.  Psychiatric:  He has a normal mood and affect.  Nursing note and vitals reviewed.  ED Treatments / Results  DIAGNOSTIC STUDIES: Oxygen Saturation is 96% on room air, normal by my interpretation.    COORDINATION OF CARE: 6:02 PM Discussed treatment plan with pt at bedside and pt agreed to plan.  Labs (all labs ordered are listed, but only abnormal results are displayed) Labs Reviewed - No data to display  EKG  EKG Interpretation None       Radiology Dg Knee Complete 4 Views  Right  Result Date: 05/16/2016 CLINICAL DATA:  Knee pain no injury EXAM: RIGHT KNEE - COMPLETE 4+ VIEW COMPARISON:  04/21/2015 FINDINGS: Subchondral bony defect in the anterior femoral condyle compatible with osteochondrosis. This was not present previously. No calcified loose body. Large joint effusion. Joint spaces are maintained.  Early chondrocalcinosis laterally. 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 Electronically Signed   By: Marlan Palauharles  Clark M.D.   On: 05/16/2016 16:16    Procedures .Joint Aspiration/Arthrocentesis Date/Time: 05/16/2016 6:41 PM Performed by: Fayrene HelperRAN, Hansini Clodfelter Authorized by: Fayrene HelperRAN, Alsha Meland   Consent:    Consent obtained:  Verbal and written   Consent given by:  Patient   Risks discussed:  Infection, pain, bleeding and nerve damage   Alternatives discussed:  No treatment and referral Location:    Location:  Knee   Knee:  R knee Anesthesia (see MAR for exact dosages):    Anesthesia method:  Local infiltration   Local anesthetic:  Lidocaine 2% w/o epi Procedure details:    Preparation: Patient was prepped and draped in usual sterile fashion     Needle gauge:  18 G   Ultrasound guidance: no     Approach:  Lateral   Aspirate amount:  60 ml   Aspirate characteristics:  Blood-tinged and yellow   Steroid injected: no     Specimen collected: no   Post-procedure details:    Dressing:  Adhesive bandage   Patient tolerance of procedure:  Tolerated well, no immediate complications   (including critical care time)  Medications Ordered in ED Medications - No data to display   Initial Impression / Assessment and Plan / ED Course  I have reviewed the triage vital signs and the nursing notes.  Pertinent labs & imaging results that were available during my care of the patient were reviewed by me and considered in my medical  decision making (see chart for details).  Clinical Course     BP 156/88   Pulse 93   Temp 98 F (36.7 C) (Oral)   Resp 20   Ht 6\' 2"  (1.88 m)   Wt 109.3 kg   SpO2 96%   BMI 30.94 kg/m    Final Clinical Impressions(s) / ED Diagnoses   Final diagnoses:  Effusion of right knee    New Prescriptions New Prescriptions   HYDROCODONE-ACETAMINOPHEN (NORCO/VICODIN) 5-325 MG TABLET    Take 2 tablets by mouth every 6 (six) hours as needed for moderate pain or severe pain.   I personally performed the services described in this documentation, which was scribed in my presence. The recorded information has been reviewed and is accurate.     6:42 PM Pt here with R knee pain and swelling.  Had knee effusion in the past requiring aspiration.  On exam he does have a R knee effusion without any sign concerning for septic joint,  cellulitis or gout.  I performed a therapeutic R knee aspiration and removed 60cc of hay color synovial fluid with slight blood tinge.  Pt felt much better.  RICE therapy discussed.  Ortho referral given.  Care discussed with Dr. Rush Landmark.    Fayrene Helper, PA-C 05/16/16 1846    Heide Scales, MD 05/17/16 [phone]

## 2016-05-16 NOTE — ED Notes (Signed)
ED Provider at bedside for aspiration of knee.

## 2016-05-16 NOTE — ED Triage Notes (Signed)
Pain in his right knee x 3 days. No injury. Hx of arthritis.

## 2017-01-25 ENCOUNTER — Encounter (HOSPITAL_BASED_OUTPATIENT_CLINIC_OR_DEPARTMENT_OTHER): Payer: Self-pay

## 2017-01-25 ENCOUNTER — Emergency Department (HOSPITAL_BASED_OUTPATIENT_CLINIC_OR_DEPARTMENT_OTHER)
Admission: EM | Admit: 2017-01-25 | Discharge: 2017-01-25 | Disposition: A | Discharge status: Home / Self Care | Payer: Medicare Other | Attending: Emergency Medicine | Admitting: Emergency Medicine

## 2017-01-25 DIAGNOSIS — T63444A Toxic effect of venom of bees, undetermined, initial encounter: Secondary | ICD-10-CM

## 2017-01-25 DIAGNOSIS — W57XXXA Bitten or stung by nonvenomous insect and other nonvenomous arthropods, initial encounter: Secondary | ICD-10-CM | POA: Diagnosis not present

## 2017-01-25 DIAGNOSIS — Z23 Encounter for immunization: Secondary | ICD-10-CM | POA: Diagnosis not present

## 2017-01-25 DIAGNOSIS — M79641 Pain in right hand: Secondary | ICD-10-CM | POA: Diagnosis present

## 2017-01-25 MED ORDER — HYDROCODONE-ACETAMINOPHEN 5-325 MG PO TABS: ORAL_TABLET | ORAL | 0 refills | Status: DC

## 2017-01-25 MED ORDER — HYDROCODONE-ACETAMINOPHEN 5-325 MG PO TABS
1.0000 | ORAL_TABLET | Freq: Once | ORAL | Status: AC
  Administered 2017-01-25: 1 via ORAL
  Filled 2017-01-25: qty 1

## 2017-01-25 MED ORDER — PREDNISONE 10 MG PO TABS
60.0000 mg | ORAL_TABLET | Freq: Once | ORAL | Status: AC
  Administered 2017-01-25: 60 mg via ORAL
  Filled 2017-01-25: qty 1

## 2017-01-25 MED ORDER — PREDNISONE 20 MG PO TABS: 40.0000 mg | ORAL_TABLET | Freq: Every day | ORAL | 0 refills | Status: DC

## 2017-01-25 MED ORDER — TETANUS-DIPHTH-ACELL PERTUSSIS 5-2.5-18.5 LF-MCG/0.5 IM SUSP
0.5000 mL | Freq: Once | INTRAMUSCULAR | Status: AC
  Administered 2017-01-25: 0.5 mL via INTRAMUSCULAR
  Filled 2017-01-25: qty 0.5

## 2017-01-25 MED FILL — HYDROCODON-APAP 5-325: 5-325 | 1 days supply | Qty: 5 | Fill #0

## 2017-01-25 MED FILL — predniSONE 20 MG TABS: 20 | 6 days supply | Qty: 12 | Fill #0

## 2017-01-25 NOTE — ED Notes (Signed)
ED Provider at bedside. 

## 2017-01-25 NOTE — ED Triage Notes (Signed)
C/o bee sting to right middle finger 830am-reports no hx of systemic/allergic reaction-NAD-steady gait

## 2017-01-25 NOTE — ED Provider Notes (Signed)
[facility] Provider Note   CSN: [id number] Arrival date & time: 01/25/17  1308     History   Chief Complaint Chief Complaint  Patient presents with  . Insect Bite    HPI  Blood pressure 138/79, pulse 69, temperature 98.7 F (37.1 C), temperature source Oral, resp. rate 20, height 6\' 2"  (1.88 m), weight 114.8 kg (253 lb), SpO2 97 %.  Ian Watson is a 66 y.o. male with no significant past medical history complaining of pain and swelling to right hand when he was bit by a bee this morning. He is unsure when his last tetanus shot was. There is no associated lip or tongue swelling, shortness of breath, wheezing, nausea or vomiting. He states that the pain is severe and worsening. No medication taken prior to arrival. Patient is right-hand-dominant.  Past Medical History:  Diagnosis Date  . Arthritis     There are no active problems to display for this patient.   History reviewed. No pertinent surgical history.     Home Medications    Prior to Admission medications   Medication Sig Start Date End Date Taking? Authorizing Provider  HYDROcodone-acetaminophen (NORCO/VICODIN) 5-325 MG tablet Take 2 tablets by mouth every 6 (six) hours as needed for moderate pain or severe pain. 05/16/16   Fayrene Helper, PA-C    Family History No family history on file.  Social History Social History  Substance Use Topics  . Smoking status: Never Smoker  . Smokeless tobacco: Never Used  . Alcohol use No     Allergies   Patient has no known allergies.   Review of Systems Review of Systems  A complete review of systems was obtained and all systems are negative except as noted in the HPI and PMH.   Physical Exam Updated Vital Signs BP 138/79 (BP Location: Left Arm)   Pulse 69   Temp 98.7 F (37.1 C) (Oral)   Resp 20   Ht 6\' 2"  (1.88 m)   Wt 114.8 kg (253 lb)   SpO2 97%   BMI 32.48 kg/m   Physical Exam  Constitutional: He is oriented to person, place, and  time. He appears well-developed and well-nourished. No distress.  HENT:  Head: Normocephalic and atraumatic.  Mouth/Throat: Oropharynx is clear and moist.  Eyes: Pupils are equal, round, and reactive to light. Conjunctivae and EOM are normal.  Neck: Normal range of motion.  Cardiovascular: Normal rate, regular rhythm and intact distal pulses.   Pulmonary/Chest: Effort normal and breath sounds normal.  Abdominal: Soft. There is no tenderness.  Musculoskeletal: Normal range of motion.  Neurological: He is alert and oriented to person, place, and time.  Skin: He is not diaphoretic.  Swelling to dorsum of right hand, full active range of motion, no significant warmth, distally neurovascular intact.  Psychiatric: He has a normal mood and affect.  Nursing note and vitals reviewed.    ED Treatments / Results  Labs (all labs ordered are listed, but only abnormal results are displayed) Labs Reviewed - No data to display  EKG  EKG Interpretation None       Radiology No results found.  Procedures Procedures (including critical care time)  Medications Ordered in ED Medications  Tdap (BOOSTRIX) injection 0.5 mL (not administered)  predniSONE (DELTASONE) tablet 60 mg (not administered)  HYDROcodone-acetaminophen (NORCO/VICODIN) 5-325 MG per tablet 1 tablet (not administered)     Initial Impression / Assessment and Plan / ED Course  I have reviewed the triage vital signs  and the nursing notes.  Pertinent labs & imaging results that were available during my care of the patient were reviewed by me and considered in my medical decision making (see chart for details).     Vitals:   01/25/17 1319  BP: 138/79  Pulse: 69  Resp: 20  Temp: 98.7 F (37.1 C)  TempSrc: Oral  SpO2: 97%  Weight: 114.8 kg (253 lb)  Height: 6\' 2"  (1.88 m)    Medications  Tdap (BOOSTRIX) injection 0.5 mL (not administered)  predniSONE (DELTASONE) tablet 60 mg (not administered)    HYDROcodone-acetaminophen (NORCO/VICODIN) 5-325 MG per tablet 1 tablet (not administered)    Ian Watson is 66 y.o. male presenting with Pain and swelling to right hand status post bee sting this morning. Tetanus shot is updated. No signs of anaphylactic reaction. He is not diabetic. He will be started on prednisone burst, pain control given at patient's request, patient given sling and advised elevation at home.  Evaluation does not show pathology that would require ongoing emergent intervention or inpatient treatment. Pt is hemodynamically stable and mentating appropriately. Discussed findings and plan with patient/guardian, who agrees with care plan. All questions answered. Return precautions discussed and outpatient follow up given.      Final Clinical Impressions(s) / ED Diagnoses   Final diagnoses:  None    New Prescriptions New Prescriptions   No medications on file     Kaylyn Limisciotta, Kahlen Boyde, PA-C 01/25/17 1348    Azalia Bilisampos, Kevin, MD 01/26/17 [phone]

## 2017-01-25 NOTE — Discharge Instructions (Signed)
Ice intermittently (in the first 24-48 hours) and elevate (Limb above the level of the heart)   Only use the arm sling for up to 2 days. Take the arm out and rotate the shoulder every 4 hours.   Please follow with your primary care doctor in the next 2 days for a check-up. They must obtain records for further management.   Do not hesitate to return to the Emergency Department for any new, worsening or concerning symptoms.

## 2017-12-11 DEATH — deceased

## 2018-12-12 ENCOUNTER — Inpatient Hospital Stay (HOSPITAL_COMMUNITY): Payer: Medicare PPO

## 2018-12-12 ENCOUNTER — Emergency Department (HOSPITAL_COMMUNITY): Payer: Medicare PPO

## 2018-12-12 ENCOUNTER — Inpatient Hospital Stay (HOSPITAL_COMMUNITY)
Admission: EM | Admit: 2018-12-12 | Discharge: 2018-12-21 | DRG: 963 | Disposition: A | Discharge status: Rehabilitation Facility | Payer: Medicare PPO | Attending: General Surgery | Admitting: General Surgery

## 2018-12-12 DIAGNOSIS — S069X0A Unspecified intracranial injury without loss of consciousness, initial encounter: Secondary | ICD-10-CM | POA: Diagnosis not present

## 2018-12-12 DIAGNOSIS — S12600A Unspecified displaced fracture of seventh cervical vertebra, initial encounter for closed fracture: Secondary | ICD-10-CM | POA: Diagnosis not present

## 2018-12-12 DIAGNOSIS — S069X0S Unspecified intracranial injury without loss of consciousness, sequela: Secondary | ICD-10-CM | POA: Diagnosis not present

## 2018-12-12 DIAGNOSIS — Z1159 Encounter for screening for other viral diseases: Secondary | ICD-10-CM

## 2018-12-12 DIAGNOSIS — S32401A Unspecified fracture of right acetabulum, initial encounter for closed fracture: Secondary | ICD-10-CM | POA: Diagnosis present

## 2018-12-12 DIAGNOSIS — R140 Abdominal distension (gaseous): Secondary | ICD-10-CM

## 2018-12-12 DIAGNOSIS — S32591A Other specified fracture of right pubis, initial encounter for closed fracture: Secondary | ICD-10-CM | POA: Diagnosis present

## 2018-12-12 DIAGNOSIS — I825Z2 Chronic embolism and thrombosis of unspecified deep veins of left distal lower extremity: Secondary | ICD-10-CM | POA: Diagnosis not present

## 2018-12-12 DIAGNOSIS — J9601 Acute respiratory failure with hypoxia: Secondary | ICD-10-CM | POA: Diagnosis present

## 2018-12-12 DIAGNOSIS — S3210XA Unspecified fracture of sacrum, initial encounter for closed fracture: Secondary | ICD-10-CM

## 2018-12-12 DIAGNOSIS — S069X1S Unspecified intracranial injury with loss of consciousness of 30 minutes or less, sequela: Secondary | ICD-10-CM | POA: Diagnosis not present

## 2018-12-12 DIAGNOSIS — I609 Nontraumatic subarachnoid hemorrhage, unspecified: Secondary | ICD-10-CM

## 2018-12-12 DIAGNOSIS — S272XXA Traumatic hemopneumothorax, initial encounter: Principal | ICD-10-CM | POA: Diagnosis present

## 2018-12-12 DIAGNOSIS — E871 Hypo-osmolality and hyponatremia: Secondary | ICD-10-CM | POA: Diagnosis not present

## 2018-12-12 DIAGNOSIS — T07XXXA Unspecified multiple injuries, initial encounter: Secondary | ICD-10-CM

## 2018-12-12 DIAGNOSIS — S2241XA Multiple fractures of ribs, right side, initial encounter for closed fracture: Secondary | ICD-10-CM | POA: Diagnosis present

## 2018-12-12 DIAGNOSIS — I82401 Acute embolism and thrombosis of unspecified deep veins of right lower extremity: Secondary | ICD-10-CM | POA: Diagnosis not present

## 2018-12-12 DIAGNOSIS — S32592A Other specified fracture of left pubis, initial encounter for closed fracture: Secondary | ICD-10-CM | POA: Diagnosis present

## 2018-12-12 DIAGNOSIS — R52 Pain, unspecified: Secondary | ICD-10-CM

## 2018-12-12 DIAGNOSIS — R2689 Other abnormalities of gait and mobility: Secondary | ICD-10-CM | POA: Diagnosis not present

## 2018-12-12 DIAGNOSIS — D649 Anemia, unspecified: Secondary | ICD-10-CM

## 2018-12-12 DIAGNOSIS — R0781 Pleurodynia: Secondary | ICD-10-CM | POA: Diagnosis not present

## 2018-12-12 DIAGNOSIS — Z86718 Personal history of other venous thrombosis and embolism: Secondary | ICD-10-CM | POA: Diagnosis not present

## 2018-12-12 DIAGNOSIS — K567 Ileus, unspecified: Secondary | ICD-10-CM

## 2018-12-12 DIAGNOSIS — S36892A Contusion of other intra-abdominal organs, initial encounter: Secondary | ICD-10-CM | POA: Diagnosis present

## 2018-12-12 DIAGNOSIS — D72823 Leukemoid reaction: Secondary | ICD-10-CM | POA: Diagnosis not present

## 2018-12-12 DIAGNOSIS — J939 Pneumothorax, unspecified: Secondary | ICD-10-CM

## 2018-12-12 DIAGNOSIS — E119 Type 2 diabetes mellitus without complications: Secondary | ICD-10-CM | POA: Diagnosis present

## 2018-12-12 DIAGNOSIS — S066X9A Traumatic subarachnoid hemorrhage with loss of consciousness of unspecified duration, initial encounter: Secondary | ICD-10-CM | POA: Diagnosis present

## 2018-12-12 DIAGNOSIS — S270XXA Traumatic pneumothorax, initial encounter: Secondary | ICD-10-CM

## 2018-12-12 DIAGNOSIS — M7989 Other specified soft tissue disorders: Secondary | ICD-10-CM | POA: Diagnosis not present

## 2018-12-12 DIAGNOSIS — I10 Essential (primary) hypertension: Secondary | ICD-10-CM | POA: Diagnosis present

## 2018-12-12 DIAGNOSIS — S32409A Unspecified fracture of unspecified acetabulum, initial encounter for closed fracture: Secondary | ICD-10-CM

## 2018-12-12 DIAGNOSIS — K5900 Constipation, unspecified: Secondary | ICD-10-CM | POA: Diagnosis not present

## 2018-12-12 DIAGNOSIS — S2239XA Fracture of one rib, unspecified side, initial encounter for closed fracture: Secondary | ICD-10-CM

## 2018-12-12 DIAGNOSIS — D500 Iron deficiency anemia secondary to blood loss (chronic): Secondary | ICD-10-CM | POA: Diagnosis not present

## 2018-12-12 DIAGNOSIS — I824Z2 Acute embolism and thrombosis of unspecified deep veins of left distal lower extremity: Secondary | ICD-10-CM | POA: Diagnosis not present

## 2018-12-12 DIAGNOSIS — S329XXA Fracture of unspecified parts of lumbosacral spine and pelvis, initial encounter for closed fracture: Secondary | ICD-10-CM

## 2018-12-12 DIAGNOSIS — D62 Acute posthemorrhagic anemia: Secondary | ICD-10-CM | POA: Diagnosis present

## 2018-12-12 DIAGNOSIS — Z4682 Encounter for fitting and adjustment of non-vascular catheter: Secondary | ICD-10-CM

## 2018-12-12 DIAGNOSIS — S2249XA Multiple fractures of ribs, unspecified side, initial encounter for closed fracture: Secondary | ICD-10-CM

## 2018-12-12 LAB — COMPREHENSIVE METABOLIC PANEL
ALT: 265 U/L — ABNORMAL HIGH (ref 0–44)
AST: 315 U/L — ABNORMAL HIGH (ref 15–41)
Albumin: 3.5 g/dL (ref 3.5–5.0)
Alkaline Phosphatase: 71 U/L (ref 38–126)
Anion gap: 10 (ref 5–15)
BUN: 15 mg/dL (ref 8–23)
CO2: 18 mmol/L — ABNORMAL LOW (ref 22–32)
Calcium: 8.5 mg/dL — ABNORMAL LOW (ref 8.9–10.3)
Chloride: 109 mmol/L (ref 98–111)
Creatinine, Ser: 0.87 mg/dL (ref 0.61–1.24)
GFR calc Af Amer: >60 mL/min (ref >60)
GFR calc non Af Amer: >60 mL/min (ref >60)
Glucose, Bld: 157 mg/dL — ABNORMAL HIGH (ref 70–99)
Potassium: 3.2 mmol/L — ABNORMAL LOW (ref 3.5–5.1)
Sodium: 137 mmol/L (ref 135–145)
Total Bilirubin: 0.5 mg/dL (ref 0.3–1.2)
Total Protein: 7.3 g/dL (ref 6.5–8.1)

## 2018-12-12 LAB — CBC
HCT: 38.8 % — ABNORMAL LOW (ref 39.0–52.0)
Hemoglobin: 12 g/dL — ABNORMAL LOW (ref 13.0–17.0)
MCH: 29.9 pg (ref 26.0–34.0)
MCHC: 30.9 g/dL (ref 30.0–36.0)
MCV: 96.5 fL (ref 80.0–100.0)
Platelets: 206 10*3/uL (ref 150–400)
RBC: 4.02 MIL/uL — ABNORMAL LOW (ref 4.22–5.81)
RDW: 13.5 % (ref 11.5–15.5)
WBC: 13.8 10*3/uL — ABNORMAL HIGH (ref 4.0–10.5)
nRBC: 0 % (ref 0.0–0.2)

## 2018-12-12 LAB — LACTIC ACID, PLASMA: Lactic Acid, Venous: 2.7 mmol/L (ref 0.5–1.9)

## 2018-12-12 LAB — SARS CORONAVIRUS 2 BY RT PCR (HOSPITAL ORDER, PERFORMED IN ~~LOC~~ HOSPITAL LAB): SARS Coronavirus 2: NEGATIVE

## 2018-12-12 LAB — PROTIME-INR
INR: 1.1 (ref 0.8–1.2)
Prothrombin Time: 13.6 seconds (ref 11.4–15.2)

## 2018-12-12 LAB — SAMPLE TO BLOOD BANK

## 2018-12-12 LAB — I-STAT BETA HCG BLOOD, ED (MC, WL, AP ONLY): I-stat hCG, quantitative: <5 m[IU]/mL (ref <5)

## 2018-12-12 LAB — ETHANOL: Alcohol, Ethyl (B): <10 mg/dL (ref <10)

## 2018-12-12 MED ORDER — DOCUSATE SODIUM 100 MG PO CAPS
100.0000 mg | ORAL_CAPSULE | Freq: Two times a day (BID) | ORAL | Status: DC
  Administered 2018-12-13 – 2018-12-20 (×16): 100 mg via ORAL
  Filled 2018-12-12 (×18): qty 1

## 2018-12-12 MED ORDER — ONDANSETRON HCL 4 MG/2ML IJ SOLN: 4.0000 mg | Freq: Four times a day (QID) | INTRAMUSCULAR | Status: DC | PRN

## 2018-12-12 MED ORDER — ONDANSETRON 4 MG PO TBDP
4.0000 mg | ORAL_TABLET | Freq: Four times a day (QID) | ORAL | Status: DC | PRN
  Filled 2018-12-12: qty 1

## 2018-12-12 MED ORDER — SODIUM CHLORIDE 0.9 % IV SOLN
INTRAVENOUS | Status: DC
  Administered 2018-12-13 – 2018-12-16 (×4): via INTRAVENOUS

## 2018-12-12 MED ORDER — METOPROLOL TARTRATE 5 MG/5ML IV SOLN: 5.0000 mg | Freq: Four times a day (QID) | INTRAVENOUS | Status: DC | PRN

## 2018-12-12 MED ORDER — MORPHINE SULFATE (PF) 4 MG/ML IV SOLN
4.0000 mg | Freq: Once | INTRAVENOUS | Status: AC
  Administered 2018-12-12: 4 mg via INTRAVENOUS
  Filled 2018-12-12: qty 1

## 2018-12-12 MED ORDER — IOHEXOL 300 MG/ML  SOLN
100.0000 mL | Freq: Once | INTRAMUSCULAR | Status: AC | PRN
  Administered 2018-12-12: 100 mL via INTRAVENOUS

## 2018-12-12 MED ORDER — MORPHINE SULFATE (PF) 2 MG/ML IV SOLN
2.0000 mg | INTRAVENOUS | Status: DC | PRN
  Administered 2018-12-12 – 2018-12-13 (×3): 2 mg via INTRAVENOUS
  Filled 2018-12-12 (×6): qty 1

## 2018-12-12 MED ORDER — HYDRALAZINE HCL 20 MG/ML IJ SOLN: 10.0000 mg | INTRAMUSCULAR | Status: DC | PRN

## 2018-12-12 MED ORDER — ACETAMINOPHEN 325 MG PO TABS
650.0000 mg | ORAL_TABLET | ORAL | Status: DC | PRN
  Administered 2018-12-20 (×2): 650 mg via ORAL
  Filled 2018-12-12 (×3): qty 2

## 2018-12-12 NOTE — ED Notes (Signed)
Per Dr Kieth Brightly no need for repeat CXR, will visualize chest tube placement on CT scan
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
## 2018-12-12 NOTE — ED Notes (Signed)
Pt c/o chest pain  No loc

## 2018-12-12 NOTE — ED Notes (Signed)
On assessment of pt, trachea noted to be ?deviated to L side. Assessed by second RN Richarda Blade MD Vanita Panda to bedside, order for repeat CXR to assess to tension PNX component. Pt continues to sat 90-93% on 6LPM [place], c/o worsening CP and SOB. MD Kinsinger alerted to new findings.

## 2018-12-12 NOTE — ED Notes (Addendum)
Trauma paged re: pt's increasing O2 requirement and soft BP. Pt now requiring 6LPM to maintain sats above 90%. C/o worsening CP and SOB.
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
## 2018-12-12 NOTE — ED Notes (Signed)
To ct

## 2018-12-12 NOTE — ED Notes (Signed)
Per MD Vanita Panda, no obvious tracheal deviation on repeat CXR. Continue to closely monitor respiratory status. Pt remains on 6LPM [place] to maintain sats above 90%. MD Kinsinger aware of plan and MD Vanita Panda input.
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
## 2018-12-13 ENCOUNTER — Inpatient Hospital Stay (HOSPITAL_COMMUNITY): Payer: Medicare PPO

## 2018-12-13 ENCOUNTER — Other Ambulatory Visit: Payer: Self-pay

## 2018-12-13 LAB — BASIC METABOLIC PANEL
Anion gap: 13 (ref 5–15)
BUN: 14 mg/dL (ref 8–23)
CO2: 20 mmol/L — ABNORMAL LOW (ref 22–32)
Calcium: 8.4 mg/dL — ABNORMAL LOW (ref 8.9–10.3)
Chloride: 105 mmol/L (ref 98–111)
Creatinine, Ser: 0.64 mg/dL (ref 0.61–1.24)
GFR calc Af Amer: >60 mL/min (ref >60)
GFR calc non Af Amer: >60 mL/min (ref >60)
Glucose, Bld: 134 mg/dL — ABNORMAL HIGH (ref 70–99)
Potassium: 4 mmol/L (ref 3.5–5.1)
Sodium: 138 mmol/L (ref 135–145)

## 2018-12-13 LAB — HIV ANTIBODY (ROUTINE TESTING W REFLEX): HIV Screen 4th Generation wRfx: NONREACTIVE

## 2018-12-13 LAB — CBC WITH DIFFERENTIAL/PLATELET
Abs Immature Granulocytes: 0.05 10*3/uL (ref 0.00–0.07)
Basophils Absolute: 0 10*3/uL (ref 0.0–0.1)
Basophils Relative: 0 %
Eosinophils Absolute: 0.1 10*3/uL (ref 0.0–0.5)
Eosinophils Relative: 1 %
HCT: 33.2 % — ABNORMAL LOW (ref 39.0–52.0)
Hemoglobin: 10.8 g/dL — ABNORMAL LOW (ref 13.0–17.0)
Immature Granulocytes: 1 %
Lymphocytes Relative: 8 %
Lymphs Abs: 0.9 10*3/uL (ref 0.7–4.0)
MCH: 30.3 pg (ref 26.0–34.0)
MCHC: 32.5 g/dL (ref 30.0–36.0)
MCV: 93.3 fL (ref 80.0–100.0)
Monocytes Absolute: 0.6 10*3/uL (ref 0.1–1.0)
Monocytes Relative: 6 %
Neutro Abs: 9 10*3/uL — ABNORMAL HIGH (ref 1.7–7.7)
Neutrophils Relative %: 84 %
Platelets: 186 10*3/uL (ref 150–400)
RBC: 3.56 MIL/uL — ABNORMAL LOW (ref 4.22–5.81)
RDW: 13.6 % (ref 11.5–15.5)
WBC: 10.6 10*3/uL — ABNORMAL HIGH (ref 4.0–10.5)
nRBC: 0 % (ref 0.0–0.2)

## 2018-12-13 LAB — MRSA PCR SCREENING: MRSA by PCR: NEGATIVE

## 2018-12-13 LAB — CDS SEROLOGY

## 2018-12-13 MED ORDER — SODIUM CHLORIDE 0.9% FLUSH
10.0000 mL | INTRAVENOUS | Status: DC | PRN
  Administered 2018-12-19 – 2018-12-21 (×2): 10 mL
  Filled 2018-12-13 (×2): qty 40

## 2018-12-13 MED ORDER — OXYCODONE HCL 5 MG PO TABS
5.0000 mg | ORAL_TABLET | ORAL | Status: DC | PRN
  Administered 2018-12-13 (×2): 10 mg via ORAL
  Administered 2018-12-13: 5 mg via ORAL
  Administered 2018-12-14 – 2018-12-19 (×19): 10 mg via ORAL
  Administered 2018-12-20: 5 mg via ORAL
  Filled 2018-12-13 (×11): qty 2
  Filled 2018-12-13: qty 1
  Filled 2018-12-13 (×8): qty 2
  Filled 2018-12-13: qty 1
  Filled 2018-12-13 (×3): qty 2

## 2018-12-13 MED ORDER — CHLORHEXIDINE GLUCONATE CLOTH 2 % EX PADS
6.0000 | MEDICATED_PAD | Freq: Every day | CUTANEOUS | Status: DC
  Administered 2018-12-13 – 2018-12-21 (×8): 6 via TOPICAL

## 2018-12-13 MED ORDER — GABAPENTIN 600 MG PO TABS
300.0000 mg | ORAL_TABLET | Freq: Three times a day (TID) | ORAL | Status: DC
  Administered 2018-12-13 – 2018-12-21 (×25): 300 mg via ORAL
  Filled 2018-12-13 (×25): qty 1

## 2018-12-13 MED ORDER — IPRATROPIUM-ALBUTEROL 0.5-2.5 (3) MG/3ML IN SOLN
3.0000 mL | Freq: Four times a day (QID) | RESPIRATORY_TRACT | Status: DC
  Administered 2018-12-13 – 2018-12-14 (×4): 3 mL via RESPIRATORY_TRACT
  Filled 2018-12-13 (×4): qty 3

## 2018-12-13 MED ORDER — METHOCARBAMOL 1000 MG/10ML IJ SOLN
1000.0000 mg | Freq: Three times a day (TID) | INTRAVENOUS | Status: DC | PRN
  Filled 2018-12-13: qty 10

## 2018-12-13 MED ORDER — SODIUM CHLORIDE 0.9% FLUSH
10.0000 mL | Freq: Two times a day (BID) | INTRAVENOUS | Status: DC
  Administered 2018-12-13 – 2018-12-15 (×4): 10 mL
  Administered 2018-12-15: 20 mL
  Administered 2018-12-17 – 2018-12-21 (×8): 10 mL
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
## 2018-12-13 NOTE — Progress Notes (Signed)
Rehab Admissions Coordinator Note:  Patient was screened by Michel Santee for appropriateness for an Inpatient Acute Rehab Consult.  At this time, we are recommending Inpatient Rehab consult.  Please place IP Rehab MD Consult order.   Michel Santee 12/13/2018, 2:41 PM  I can be reached at [phone].
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
## 2018-12-14 ENCOUNTER — Inpatient Hospital Stay (HOSPITAL_COMMUNITY): Payer: Medicare PPO

## 2018-12-14 LAB — CBC
HCT: 30.5 % — ABNORMAL LOW (ref 39.0–52.0)
Hemoglobin: 9.8 g/dL — ABNORMAL LOW (ref 13.0–17.0)
MCH: 30.2 pg (ref 26.0–34.0)
MCHC: 32.1 g/dL (ref 30.0–36.0)
MCV: 93.8 fL (ref 80.0–100.0)
Platelets: 145 10*3/uL — ABNORMAL LOW (ref 150–400)
RBC: 3.25 MIL/uL — ABNORMAL LOW (ref 4.22–5.81)
RDW: 13.8 % (ref 11.5–15.5)
WBC: 10.2 10*3/uL (ref 4.0–10.5)
nRBC: 0 % (ref 0.0–0.2)

## 2018-12-14 LAB — BASIC METABOLIC PANEL
Anion gap: 10 (ref 5–15)
BUN: 10 mg/dL (ref 8–23)
CO2: 23 mmol/L (ref 22–32)
Calcium: 8.3 mg/dL — ABNORMAL LOW (ref 8.9–10.3)
Chloride: 103 mmol/L (ref 98–111)
Creatinine, Ser: 0.75 mg/dL (ref 0.61–1.24)
GFR calc Af Amer: >60 mL/min (ref >60)
GFR calc non Af Amer: >60 mL/min (ref >60)
Glucose, Bld: 127 mg/dL — ABNORMAL HIGH (ref 70–99)
Potassium: 3.9 mmol/L (ref 3.5–5.1)
Sodium: 136 mmol/L (ref 135–145)

## 2018-12-14 MED ORDER — IPRATROPIUM-ALBUTEROL 0.5-2.5 (3) MG/3ML IN SOLN: 3.0000 mL | Freq: Four times a day (QID) | RESPIRATORY_TRACT | Status: DC | PRN
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
## 2018-12-14 NOTE — Progress Notes (Signed)
Inpatient Rehabilitation Admissions Coordinator  Inpatient Rehab Consult received. I met with patient at the bedside for rehabilitation assessment. We discussed goals and expectations of an inpatient rehab admission.   Chest tube remains. I will follow up next week to continue to assess his rehab needs pending his progress with therapy.  Danne Baxter, RN, MSN Rehab Admissions Coordinator [phone] 12/14/2018 11:26 AM
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
## 2018-12-15 ENCOUNTER — Inpatient Hospital Stay (HOSPITAL_COMMUNITY): Payer: Medicare PPO

## 2018-12-15 LAB — CBC
HCT: 30.4 % — ABNORMAL LOW (ref 39.0–52.0)
Hemoglobin: 9.7 g/dL — ABNORMAL LOW (ref 13.0–17.0)
MCH: 30 pg (ref 26.0–34.0)
MCHC: 31.9 g/dL (ref 30.0–36.0)
MCV: 94.1 fL (ref 80.0–100.0)
Platelets: 150 10*3/uL (ref 150–400)
RBC: 3.23 MIL/uL — ABNORMAL LOW (ref 4.22–5.81)
RDW: 13.6 % (ref 11.5–15.5)
WBC: 10.4 10*3/uL (ref 4.0–10.5)
nRBC: 0 % (ref 0.0–0.2)
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
## 2018-12-16 ENCOUNTER — Encounter (HOSPITAL_COMMUNITY): Payer: Self-pay

## 2018-12-16 ENCOUNTER — Inpatient Hospital Stay (HOSPITAL_COMMUNITY): Payer: Medicare PPO

## 2018-12-16 LAB — URINALYSIS, ROUTINE W REFLEX MICROSCOPIC
Bilirubin Urine: NEGATIVE
Glucose, UA: NEGATIVE mg/dL
Ketones, ur: NEGATIVE mg/dL
Leukocytes,Ua: NEGATIVE
Nitrite: NEGATIVE
Protein, ur: 30 mg/dL — AB
Specific Gravity, Urine: 1.028 (ref 1.005–1.030)
pH: 6 (ref 5.0–8.0)

## 2018-12-16 NOTE — Progress Notes (Addendum)
Patient ID: Ian MedinaClyde Watson, male   DOB: 04/01/1951, 68 y.o.   MRN: [id number] [facility] Progress Note:   * No surgery found *  Subjective: Mental status is clear Objective: Vital signs in last 24 hours: Temp:  [98.5 F (36.9 C)-100.2 F (37.9 C)] 99.8 F (37.7 C) (07/05 0437) Pulse Rate:  [80-86] 86 (07/05 0437) Resp:  [11-24] 18 (07/05 0437) BP: (122-147)/(64-74) 123/64 (07/05 0437) SpO2:  [85 %-100 %] 95 % (07/05 0437) Weight:  [114.8 kg] 114.8 kg (07/04 2335)  Intake/Output from previous day: 07/04 0701 - 07/05 0700 In: 560 [P.O.:560] Out: 650 [Urine:600; Chest Tube:50] Intake/Output this shift: Total I/O In: 200 [P.O.:200] Out: -   Physical Exam: Work of breathing is not labored  Lab Results:  Results for orders placed or performed during the hospital encounter of 12/12/18 (from the past 48 hour(s))  CBC     Status: Abnormal   Collection Time: 12/15/18  2:17 AM  Result Value Ref Range   WBC 10.4 4.0 - 10.5 K/uL   RBC 3.23 (L) 4.22 - 5.81 MIL/uL   Hemoglobin 9.7 (L) 13.0 - 17.0 g/dL   HCT 40.930.4 (L) 81.139.0 - 91.452.0 %   MCV 94.1 80.0 - 100.0 fL   MCH 30.0 26.0 - 34.0 pg   MCHC 31.9 30.0 - 36.0 g/dL   RDW 78.213.6 95.611.5 - 21.315.5 %   Platelets 150 150 - 400 K/uL   nRBC 0.0 0.0 - 0.2 %    Comment: Performed at [facility], [address] [place], [place] [address]  Urinalysis, Routine w reflex microscopic     Status: Abnormal   Collection Time: 12/16/18  4:59 AM  Result Value Ref Range   Color, Urine AMBER (A) YELLOW    Comment: BIOCHEMICALS MAY BE AFFECTED BY COLOR   APPearance CLEAR CLEAR   Specific Gravity, Urine 1.028 1.005 - 1.030   pH 6.0 5.0 - 8.0   Glucose, UA NEGATIVE NEGATIVE mg/dL   Hgb urine dipstick SMALL (A) NEGATIVE   Bilirubin Urine NEGATIVE NEGATIVE   Ketones, ur NEGATIVE NEGATIVE mg/dL   Protein, ur 30 (A) NEGATIVE mg/dL   Nitrite NEGATIVE NEGATIVE   Leukocytes,Ua NEGATIVE NEGATIVE   RBC / HPF 11-20 0 - 5 RBC/hpf   WBC, UA 0-5 0 - 5  WBC/hpf   Bacteria, UA MANY (A) NONE SEEN   Squamous Epithelial / LPF 0-5 0 - 5   Mucus PRESENT     Comment: Performed at [facility], [address] [place], [place] [address]    Radiology/Results: Dg Chest Port 1 View  Result Date: 12/15/2018 CLINICAL DATA:  Multiple rib fractures. EXAM: PORTABLE CHEST 1 VIEW COMPARISON:  Radiograph December 14, 2018. FINDINGS: Stable cardiomediastinal silhouette. Left lung is clear. Right-sided chest tube is unchanged in position. Stable right rib fractures are noted. Mild subcutaneous emphysema is seen over the right lateral chest tube. Mild right basilar subsegmental atelectasis and minimal right pleural effusion is noted. Elevated right hemidiaphragm is noted. No definite pneumothorax is noted. IMPRESSION: Stable position of right-sided chest tube. No definite pneumothorax is noted, although some degree of subcutaneous emphysema is seen over right lateral chest wall. Mild right basilar subsegmental atelectasis and minimal right pleural effusion is noted. Electronically Signed   By: Lupita RaiderJames  Green Jr M.D.   On: 12/15/2018 09:26    Anti-infectives: Anti-infectives (From admission, onward)   None      Assessment/Plan: Problem List: Patient Active Problem List  Diagnosis Date Noted  . Pneumothorax 12/12/2018    Will repeat chest xray;  Prior subcut air with pleural fluid but no definite pneumothorax.  CT still to 20 cm suction * No surgery found *    LOS: 4 days   Matt B. Hassell Done, MD, [facility] [phone] beeper [phone]  12/16/2018 10:01 AM
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
## 2018-12-16 NOTE — Progress Notes (Addendum)
2335 Received pt from 4N, AOX4, in moderate pain with R chest tube, suction: 20 cm, no leakage. Oxygen level: 85% on RA. Hooked on 02 via nasal cannula at 1 L, 02 level at 92-93%, increased O2 to 2 L, running at 94-95 %.  Oriented to room and plan of care. Pt is aware that wife is also in the hospital and verbalized that he was able to talk to her. Call bell at reach. Lying comfortably in bed.  2358 Given oxy 10 mg, tolerated well. Will continue to monitor.

## 2018-12-16 NOTE — Plan of Care (Signed)
  Problem: Clinical Measurements: Goal: Ability to maintain clinical measurements within normal limits will improve Outcome: Progressing Goal: Will remain free from infection Outcome: Progressing   Problem: Pain Managment: Goal: General experience of comfort will improve Outcome: Progressing   

## 2018-12-17 ENCOUNTER — Inpatient Hospital Stay (HOSPITAL_COMMUNITY): Payer: Medicare PPO

## 2018-12-17 MED ORDER — POLYETHYLENE GLYCOL 3350 17 G PO PACK
17.0000 g | PACK | Freq: Every day | ORAL | Status: DC
  Administered 2018-12-17 – 2018-12-18 (×2): 17 g via ORAL
  Filled 2018-12-17 (×2): qty 1

## 2018-12-17 MED ORDER — METHOCARBAMOL 500 MG PO TABS
1000.0000 mg | ORAL_TABLET | Freq: Three times a day (TID) | ORAL | Status: DC
  Administered 2018-12-17 – 2018-12-21 (×13): 1000 mg via ORAL
  Filled 2018-12-17 (×13): qty 2

## 2018-12-17 MED ORDER — BISACODYL 10 MG RE SUPP: 10.0000 mg | Freq: Every day | RECTAL | Status: DC | PRN

## 2018-12-17 NOTE — Progress Notes (Signed)
Inpatient Rehabilitation Admissions Coordinator  I met with patient at bedside. Chest tube remains. I await medical readiness to begin insurance approval with [facility] for a possible inpt rehab admit pending insurance approval. I will contact his wife and son today via phone to discuss.  Danne Baxter, RN, MSN Rehab Admissions Coordinator [phone] 12/17/2018 11:18 AM

## 2018-12-17 NOTE — Plan of Care (Signed)
  Problem: Clinical Measurements: Goal: Will remain free from infection Outcome: Progressing   Problem: Elimination: Goal: Will not experience complications related to urinary retention Outcome: Progressing   Problem: Pain Managment: Goal: General experience of comfort will improve Outcome: Progressing   

## 2018-12-17 NOTE — Plan of Care (Signed)
  Problem: Elimination: Goal: Will not experience complications related to urinary retention Outcome: Progressing   Problem: Pain Managment: Goal: General experience of comfort will improve Outcome: Progressing   Problem: Skin Integrity: Goal: Risk for impaired skin integrity will decrease Outcome: Progressing   

## 2018-12-17 NOTE — Progress Notes (Signed)
Orders received to place patients chest tube to water seal. Educated patient and instructed patient to inform nurse if any difficulty breathing or shortness of breath occurs. Patient verbalized understanding. Right chest tube placed to water seal at this time.Will continue to monitor closely for remainder of shift.
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
## 2018-12-18 ENCOUNTER — Inpatient Hospital Stay (HOSPITAL_COMMUNITY): Payer: Medicare PPO

## 2018-12-18 LAB — BASIC METABOLIC PANEL
Anion gap: 9 (ref 5–15)
BUN: 14 mg/dL (ref 8–23)
CO2: 24 mmol/L (ref 22–32)
Calcium: 8.2 mg/dL — ABNORMAL LOW (ref 8.9–10.3)
Chloride: 100 mmol/L (ref 98–111)
Creatinine, Ser: 0.65 mg/dL (ref 0.61–1.24)
GFR calc Af Amer: >60 mL/min (ref >60)
GFR calc non Af Amer: >60 mL/min (ref >60)
Glucose, Bld: 120 mg/dL — ABNORMAL HIGH (ref 70–99)
Potassium: 4.2 mmol/L (ref 3.5–5.1)
Sodium: 133 mmol/L — ABNORMAL LOW (ref 135–145)

## 2018-12-18 LAB — CBC
HCT: 30.6 % — ABNORMAL LOW (ref 39.0–52.0)
Hemoglobin: 9.8 g/dL — ABNORMAL LOW (ref 13.0–17.0)
MCH: 29.9 pg (ref 26.0–34.0)
MCHC: 32 g/dL (ref 30.0–36.0)
MCV: 93.3 fL (ref 80.0–100.0)
Platelets: 230 10*3/uL (ref 150–400)
RBC: 3.28 MIL/uL — ABNORMAL LOW (ref 4.22–5.81)
RDW: 13.4 % (ref 11.5–15.5)
WBC: 11.4 10*3/uL — ABNORMAL HIGH (ref 4.0–10.5)
nRBC: 0.3 % — ABNORMAL HIGH (ref 0.0–0.2)

## 2018-12-18 MED ORDER — BISACODYL 10 MG RE SUPP
10.0000 mg | Freq: Once | RECTAL | Status: AC
  Administered 2018-12-18: 10 mg via RECTAL
  Filled 2018-12-18: qty 1
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
## 2018-12-18 NOTE — Care Management Important Message (Signed)
Important Message  Patient Details  Name: Ian Watson MRN: [id number] Date of Birth: [birth year]   Medicare Important Message Given:  Yes     Memory Argue 12/18/2018, 3:15 PM    SIGNED

## 2018-12-18 NOTE — Plan of Care (Signed)
  Problem: Elimination: Goal: Will not experience complications related to bowel motility Outcome: Progressing Goal: Will not experience complications related to urinary retention Outcome: Progressing   Problem: Pain Managment: Goal: General experience of comfort will improve Outcome: Progressing   Problem: Skin Integrity: Goal: Risk for impaired skin integrity will decrease Outcome: Progressing   

## 2018-12-18 NOTE — Progress Notes (Addendum)
Inpatient Rehabilitation Admissions Coordinator  Noted Chest tube removed. I will begin insurance approval with [facility] Medicare once he progresses further with therapy for possible inpt rehab admit.  Danne Baxter, RN, MSN Rehab Admissions Coordinator [phone] 12/18/2018 12:58 PM'
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
## 2018-12-19 ENCOUNTER — Inpatient Hospital Stay (HOSPITAL_COMMUNITY): Payer: Medicare PPO

## 2018-12-19 MED ORDER — POLYETHYLENE GLYCOL 3350 17 G PO PACK
17.0000 g | PACK | Freq: Two times a day (BID) | ORAL | Status: DC
  Administered 2018-12-19 – 2018-12-20 (×4): 17 g via ORAL
  Filled 2018-12-19 (×5): qty 1
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
## 2018-12-20 NOTE — Plan of Care (Signed)
  Problem: Clinical Measurements: Goal: Ability to maintain clinical measurements within normal limits will improve Outcome: Progressing   Problem: Elimination: Goal: Will not experience complications related to bowel motility Outcome: Progressing Goal: Will not experience complications related to urinary retention Outcome: Progressing   Problem: Pain Managment: Goal: General experience of comfort will improve Outcome: Progressing   

## 2018-12-20 NOTE — Plan of Care (Signed)
  Problem: Education: Goal: Knowledge of General Education information will improve Description: Including pain rating scale, medication(s)/side effects and non-pharmacologic comfort measures Outcome: Progressing   Problem: Clinical Measurements: Goal: Ability to maintain clinical measurements within normal limits will improve Outcome: Progressing   Problem: Elimination: Goal: Will not experience complications related to bowel motility Outcome: Progressing Goal: Will not experience complications related to urinary retention Outcome: Progressing   Problem: Pain Managment: Goal: General experience of comfort will improve Outcome: Progressing
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
## 2018-12-21 ENCOUNTER — Encounter (HOSPITAL_COMMUNITY): Payer: Self-pay

## 2018-12-21 ENCOUNTER — Encounter (HOSPITAL_COMMUNITY): Payer: Medicare PPO

## 2018-12-21 ENCOUNTER — Inpatient Hospital Stay (HOSPITAL_COMMUNITY)
Admission: RE | Admit: 2018-12-21 | Discharge: 2019-01-01 | DRG: 949 | Disposition: A | Discharge status: Home Health | Payer: Medicare PPO | Source: Intra-hospital | Attending: Physical Medicine & Rehabilitation | Admitting: Physical Medicine & Rehabilitation

## 2018-12-21 ENCOUNTER — Other Ambulatory Visit: Payer: Self-pay

## 2018-12-21 DIAGNOSIS — S3282XD Multiple fractures of pelvis without disruption of pelvic ring, subsequent encounter for fracture with routine healing: Secondary | ICD-10-CM | POA: Diagnosis not present

## 2018-12-21 DIAGNOSIS — K567 Ileus, unspecified: Secondary | ICD-10-CM | POA: Diagnosis present

## 2018-12-21 DIAGNOSIS — I82462 Acute embolism and thrombosis of left calf muscular vein: Secondary | ICD-10-CM | POA: Diagnosis not present

## 2018-12-21 DIAGNOSIS — T07XXXA Unspecified multiple injuries, initial encounter: Secondary | ICD-10-CM

## 2018-12-21 DIAGNOSIS — E871 Hypo-osmolality and hyponatremia: Secondary | ICD-10-CM | POA: Diagnosis present

## 2018-12-21 DIAGNOSIS — E669 Obesity, unspecified: Secondary | ICD-10-CM | POA: Diagnosis present

## 2018-12-21 DIAGNOSIS — I82401 Acute embolism and thrombosis of unspecified deep veins of right lower extremity: Secondary | ICD-10-CM | POA: Diagnosis not present

## 2018-12-21 DIAGNOSIS — Z6832 Body mass index (BMI) 32.0-32.9, adult: Secondary | ICD-10-CM | POA: Diagnosis not present

## 2018-12-21 DIAGNOSIS — S066X9D Traumatic subarachnoid hemorrhage with loss of consciousness of unspecified duration, subsequent encounter: Principal | ICD-10-CM

## 2018-12-21 DIAGNOSIS — S069X0A Unspecified intracranial injury without loss of consciousness, initial encounter: Secondary | ICD-10-CM | POA: Diagnosis not present

## 2018-12-21 DIAGNOSIS — I825Z2 Chronic embolism and thrombosis of unspecified deep veins of left distal lower extremity: Secondary | ICD-10-CM | POA: Diagnosis not present

## 2018-12-21 DIAGNOSIS — S12600A Unspecified displaced fracture of seventh cervical vertebra, initial encounter for closed fracture: Secondary | ICD-10-CM | POA: Diagnosis not present

## 2018-12-21 DIAGNOSIS — R2689 Other abnormalities of gait and mobility: Secondary | ICD-10-CM | POA: Diagnosis not present

## 2018-12-21 DIAGNOSIS — S2241XD Multiple fractures of ribs, right side, subsequent encounter for fracture with routine healing: Secondary | ICD-10-CM

## 2018-12-21 DIAGNOSIS — Z86718 Personal history of other venous thrombosis and embolism: Secondary | ICD-10-CM | POA: Diagnosis not present

## 2018-12-21 DIAGNOSIS — D72829 Elevated white blood cell count, unspecified: Secondary | ICD-10-CM | POA: Diagnosis present

## 2018-12-21 DIAGNOSIS — S069X0S Unspecified intracranial injury without loss of consciousness, sequela: Secondary | ICD-10-CM | POA: Diagnosis not present

## 2018-12-21 DIAGNOSIS — I82409 Acute embolism and thrombosis of unspecified deep veins of unspecified lower extremity: Secondary | ICD-10-CM

## 2018-12-21 DIAGNOSIS — S32591A Other specified fracture of right pubis, initial encounter for closed fracture: Secondary | ICD-10-CM | POA: Diagnosis not present

## 2018-12-21 DIAGNOSIS — S2249XA Multiple fractures of ribs, unspecified side, initial encounter for closed fracture: Secondary | ICD-10-CM

## 2018-12-21 DIAGNOSIS — M7989 Other specified soft tissue disorders: Secondary | ICD-10-CM | POA: Diagnosis not present

## 2018-12-21 DIAGNOSIS — S32401D Unspecified fracture of right acetabulum, subsequent encounter for fracture with routine healing: Secondary | ICD-10-CM | POA: Diagnosis not present

## 2018-12-21 DIAGNOSIS — S272XXD Traumatic hemopneumothorax, subsequent encounter: Secondary | ICD-10-CM

## 2018-12-21 DIAGNOSIS — R0781 Pleurodynia: Secondary | ICD-10-CM | POA: Diagnosis not present

## 2018-12-21 DIAGNOSIS — S069XAA Unspecified intracranial injury with loss of consciousness status unknown, initial encounter: Secondary | ICD-10-CM | POA: Diagnosis present

## 2018-12-21 DIAGNOSIS — D649 Anemia, unspecified: Secondary | ICD-10-CM

## 2018-12-21 DIAGNOSIS — D62 Acute posthemorrhagic anemia: Secondary | ICD-10-CM | POA: Diagnosis present

## 2018-12-21 DIAGNOSIS — I609 Nontraumatic subarachnoid hemorrhage, unspecified: Secondary | ICD-10-CM | POA: Diagnosis not present

## 2018-12-21 DIAGNOSIS — S3210XD Unspecified fracture of sacrum, subsequent encounter for fracture with routine healing: Secondary | ICD-10-CM | POA: Diagnosis not present

## 2018-12-21 DIAGNOSIS — S069X9A Unspecified intracranial injury with loss of consciousness of unspecified duration, initial encounter: Secondary | ICD-10-CM | POA: Diagnosis present

## 2018-12-21 DIAGNOSIS — I824Z2 Acute embolism and thrombosis of unspecified deep veins of left distal lower extremity: Secondary | ICD-10-CM

## 2018-12-21 DIAGNOSIS — S32409A Unspecified fracture of unspecified acetabulum, initial encounter for closed fracture: Secondary | ICD-10-CM

## 2018-12-21 DIAGNOSIS — G8918 Other acute postprocedural pain: Secondary | ICD-10-CM

## 2018-12-21 DIAGNOSIS — S2239XA Fracture of one rib, unspecified side, initial encounter for closed fracture: Secondary | ICD-10-CM

## 2018-12-21 DIAGNOSIS — S32592A Other specified fracture of left pubis, initial encounter for closed fracture: Secondary | ICD-10-CM

## 2018-12-21 DIAGNOSIS — S069X1S Unspecified intracranial injury with loss of consciousness of 30 minutes or less, sequela: Secondary | ICD-10-CM | POA: Diagnosis not present

## 2018-12-21 DIAGNOSIS — S3210XA Unspecified fracture of sacrum, initial encounter for closed fracture: Secondary | ICD-10-CM

## 2018-12-21 MED ORDER — BISACODYL 10 MG RE SUPP: 10.0000 mg | Freq: Every day | RECTAL | Status: DC | PRN

## 2018-12-21 MED ORDER — DOCUSATE SODIUM 100 MG PO CAPS
100.0000 mg | ORAL_CAPSULE | Freq: Two times a day (BID) | ORAL | Status: DC
  Administered 2018-12-21 – 2019-01-01 (×21): 100 mg via ORAL
  Filled 2018-12-21 (×22): qty 1

## 2018-12-21 MED ORDER — ACETAMINOPHEN 325 MG PO TABS
650.0000 mg | ORAL_TABLET | ORAL | Status: DC | PRN
  Administered 2018-12-26 – 2018-12-27 (×2): 650 mg via ORAL
  Filled 2018-12-21 (×3): qty 2

## 2018-12-21 MED ORDER — GABAPENTIN 600 MG PO TABS
300.0000 mg | ORAL_TABLET | Freq: Three times a day (TID) | ORAL | Status: DC
  Administered 2018-12-21 – 2019-01-01 (×33): 300 mg via ORAL
  Filled 2018-12-21 (×33): qty 1

## 2018-12-21 MED ORDER — OXYCODONE HCL 5 MG PO TABS
5.0000 mg | ORAL_TABLET | ORAL | Status: DC | PRN
  Administered 2018-12-21 – 2018-12-24 (×5): 10 mg via ORAL
  Administered 2018-12-27 – 2018-12-28 (×4): 5 mg via ORAL
  Administered 2018-12-31: 10 mg via ORAL
  Filled 2018-12-21: qty 2
  Filled 2018-12-21: qty 1
  Filled 2018-12-21: qty 2
  Filled 2018-12-21: qty 1
  Filled 2018-12-21 (×4): qty 2
  Filled 2018-12-21 (×2): qty 1
  Filled 2018-12-21 (×2): qty 2

## 2018-12-21 MED ORDER — METHOCARBAMOL 500 MG PO TABS
1000.0000 mg | ORAL_TABLET | Freq: Three times a day (TID) | ORAL | Status: DC
  Administered 2018-12-21 – 2019-01-01 (×33): 1000 mg via ORAL
  Filled 2018-12-21 (×33): qty 2

## 2018-12-21 MED ORDER — ONDANSETRON HCL 4 MG/2ML IJ SOLN: 4.0000 mg | Freq: Four times a day (QID) | INTRAMUSCULAR | Status: DC | PRN

## 2018-12-21 MED ORDER — SORBITOL 70 % SOLN: 30.0000 mL | Freq: Every day | Status: DC | PRN

## 2018-12-21 MED ORDER — POLYETHYLENE GLYCOL 3350 17 G PO PACK
17.0000 g | PACK | Freq: Two times a day (BID) | ORAL | Status: DC
  Administered 2018-12-21 – 2019-01-01 (×17): 17 g via ORAL
  Filled 2018-12-21 (×20): qty 1

## 2018-12-21 MED ORDER — ONDANSETRON 4 MG PO TBDP
4.0000 mg | ORAL_TABLET | Freq: Four times a day (QID) | ORAL | Status: DC | PRN
  Filled 2018-12-21: qty 1

## 2018-12-21 MED ORDER — IPRATROPIUM-ALBUTEROL 0.5-2.5 (3) MG/3ML IN SOLN: 3.0000 mL | Freq: Four times a day (QID) | RESPIRATORY_TRACT | Status: DC | PRN
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
## 2018-12-21 NOTE — Progress Notes (Signed)
Report given to [place], Therapist, sports. Will transfer Pt to 4W Rm 2
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
## 2018-12-21 NOTE — Progress Notes (Signed)
Pt for discharge today going to in patient rehab waiting of bed endorsed to next nurse.

## 2018-12-21 NOTE — Progress Notes (Signed)
Patient arrived to room and oriented to rehab unit. Verbalized agreement to call for all needs. No questions or complaints at this time.

## 2018-12-21 NOTE — Care Management Important Message (Signed)
Important Message  Patient Details  Name: Alen Matheson MRN: [id number] Date of Birth: 1950/12/25   Medicare Important Message Given:  Yes     Shelda Altes 12/21/2018, 3:02 PM

## 2018-12-21 NOTE — Plan of Care (Signed)
  Problem: RH BLADDER ELIMINATION Goal: RH STG MANAGE BLADDER WITH ASSISTANCE Description: STG Manage Bladder With Assistance. Mod I Outcome: Progressing   Problem: RH SKIN INTEGRITY Goal: RH STG SKIN FREE OF INFECTION/BREAKDOWN Description: Free of breakdown and infection. Mod I Outcome: Progressing   Problem: RH SAFETY Goal: RH STG ADHERE TO SAFETY PRECAUTIONS W/ASSISTANCE/DEVICE Description: STG Adhere to Safety Precautions With Assistance/Device. Mod I Outcome: Progressing
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
## 2018-12-21 NOTE — Discharge Instructions (Signed)
Please wear your cervical collar for the next 6 weeks until you follow up with Dr. Maurice Smallstergard of Neurosurgery.   You are weight bearing as tolerated to the lower extremities. Please follow up in 2-3 weeks with Orthopedics, Dr. Jena GaussHaddix, for repeat imaging and follow up.    Rib Fracture  A rib fracture is a break or crack in one of the bones of the ribs. The ribs are long, curved bones that wrap around your chest and attach to your spine and your breastbone. The ribs protect your heart, lungs, and other organs in the chest. A broken or cracked rib is often painful but is not usually serious. Most rib fractures heal on their own over time. However, rib fractures can be more serious if multiple ribs are broken or if broken ribs move out of place and push against other structures or organs. What are the causes? This condition is caused by:  Repetitive movements with high force, such as pitching a baseball or having severe coughing spells.  A direct blow to the chest, such as a sports injury, a car accident, or a fall.  Cancer that has spread to the bones, which can weaken bones and cause them to break. What are the signs or symptoms? Symptoms of this condition include:  Pain when you breathe in or cough.  Pain when someone presses on the injured area.  Feeling short of breath. How is this diagnosed? This condition is diagnosed with a physical exam and medical history. Imaging tests may also be done, such as:  Chest X-ray.  CT scan.  MRI.  Bone scan.  Chest ultrasound. How is this treated? Treatment for this condition depends on the severity of the fracture. Most rib fractures usually heal on their own in 1-3 months. Sometimes healing takes longer if there is a cough that does not stop or if there are other activities that make the injury worse (aggravating factors). While you heal, you will be given medicines to control the pain. You will also be taught deep breathing  exercises. Severe injuries may require hospitalization or surgery. Follow these instructions at home: Managing pain, stiffness, and swelling  If directed, apply ice to the injured area. ? Put ice in a plastic bag. ? Place a towel between your skin and the bag. ? Leave the ice on for 20 minutes, 2-3 times a day.  Take over-the-counter and prescription medicines only as told by your health care provider. Activity  Avoid a lot of activity and any activities or movements that cause pain. Be careful during activities and avoid bumping the injured rib.  Slowly increase your activity as told by your health care provider. General instructions  Do deep breathing exercises as told by your health care provider. This helps prevent pneumonia, which is a common complication of a broken rib. Your health care provider may instruct you to: ? Take deep breaths several times a day. ? Try to cough several times a day, holding a pillow against the injured area. ? Use a device called incentive spirometer to practice deep breathing several times a day.  Drink enough fluid to keep your urine pale yellow.  Do not wear a rib belt or binder. These restrict breathing, which can lead to pneumonia.  Keep all follow-up visits as told by your health care provider. This is important. Contact a health care provider if:  You have a fever. Get help right away if:  You have difficulty breathing or you are short of breath.  You develop a cough that does not stop, or you cough up thick or bloody sputum.  You have nausea, vomiting, or pain in your abdomen.  Your pain gets worse and medicine does not help. Summary  A rib fracture is a break or crack in one of the bones of the ribs.  A broken or cracked rib is often painful but is not usually serious.  Most rib fractures heal on their own over time.  Treatment for this condition depends on the severity of the fracture.  Avoid a lot of activity and any  activities or movements that cause pain. This information is not intended to replace advice given to you by your health care provider. Make sure you discuss any questions you have with your health care provider. Document Released: 05/30/2005 Document Revised: 05/12/2017 Document Reviewed: 08/29/2016 Elsevier Patient Education  2020 [employer]   How To Use an Incentive Spirometer An incentive spirometer is a tool that measures how well you are filling your lungs with each breath. Learning to take long, deep breaths using this tool can help you keep your lungs clear and active. This may help to reverse or lessen your chance of developing breathing (pulmonary) problems, especially infection. You may be asked to use a spirometer:  After a surgery.  If you have a lung problem or a history of smoking.  After a long period of time when you have been unable to move or be active. If the spirometer includes an indicator to show the highest number that you have reached, your health care provider or respiratory therapist will help you set a goal. Keep a list (log) of your progress as told by your health care provider. What are the risks?  Breathing too quickly may cause dizziness or cause you to pass out. Take your time so you do not get dizzy or light-headed.  If you are in pain, you may need to take pain medicine before doing incentive spirometry. It is harder to take a deep breath if you are having pain. How to use your incentive spirometer  1. Sit up on the edge of your bed or on a chair. 2. Hold the incentive spirometer so that it is in an upright position. 3. Before you use the spirometer, breathe out normally. 4. Place the mouthpiece in your mouth. Make sure your lips are closed tightly around it. 5. Breathe in slowly and as deeply as you can through your mouth, causing the piston or the ball to rise toward the top of the chamber. 6. Hold your breath for 3-5 seconds, or for as long as  possible. ? If the spirometer includes a coach indicator, use this to guide you in breathing. Slow down your breathing if the indicator goes above the marked areas. 7. Remove the mouthpiece from your mouth and breathe out normally. The piston or ball will return to the bottom of the chamber. 8. Rest for a few seconds, then repeat the steps 10 or more times. ? Take your time and take a few normal breaths between deep breaths so that you do not get dizzy or light-headed. ? Do this every 1-2 hours when you are awake. 9. If the spirometer includes a goal marker to show the highest number you have reached (best effort), use this as a goal to work toward during each repetition. 10. After each set of 10 deep breaths, cough a few times. This will help to make sure that your lungs are clear. ? If  you have an incision on your chest or abdomen from surgery, place a pillow or a rolled-up towel firmly against the incision when you cough. This can help to reduce pain from coughing. General tips  When you become able to get out of bed, walk around often and continue to cough to help clear your lungs.  Keep using the incentive spirometer until your health care provider says it is okay to stop using it. If you have been in the hospital, you may be told to keep using the spirometer at home. Contact a health care provider if:  You are having difficulty using the spirometer.  You have trouble using the spirometer as often as instructed.  Your pain medicine is not giving enough relief for you to use the spirometer as told.  You have a fever.  You develop shortness of breath. Get help right away if:  You develop a cough with bloody mucus from the lungs (bloody sputum).  You have fluid or blood coming from an incision site after you cough. Summary  An incentive spirometer is a tool that can help you learn to take long, deep breaths to keep your lungs clear and active.  You may be asked to use a spirometer  after a surgery, if you have a lung problem or a history of smoking, or if you have been inactive for a long period of time.  Use your incentive spirometer as instructed every 1-2 hours while you are awake.  If you have an incision on your chest or abdomen, place a pillow or a rolled-up towel firmly against your incision when you cough. This will help to reduce pain. This information is not intended to replace advice given to you by your health care provider. Make sure you discuss any questions you have with your health care provider. Document Released: 10/10/2006 Document Revised: 06/22/2017 Document Reviewed: 04/12/2017 Elsevier Patient Education  2020 [employer].

## 2018-12-22 ENCOUNTER — Inpatient Hospital Stay (HOSPITAL_COMMUNITY): Payer: Medicare PPO | Admitting: Speech Pathology

## 2018-12-22 ENCOUNTER — Inpatient Hospital Stay (HOSPITAL_COMMUNITY): Payer: Medicare PPO | Admitting: Physical Therapy

## 2018-12-22 ENCOUNTER — Inpatient Hospital Stay (HOSPITAL_COMMUNITY): Payer: Medicare PPO | Admitting: Occupational Therapy

## 2018-12-22 DIAGNOSIS — S32591A Other specified fracture of right pubis, initial encounter for closed fracture: Secondary | ICD-10-CM

## 2018-12-22 DIAGNOSIS — E871 Hypo-osmolality and hyponatremia: Secondary | ICD-10-CM

## 2018-12-22 DIAGNOSIS — I609 Nontraumatic subarachnoid hemorrhage, unspecified: Secondary | ICD-10-CM

## 2018-12-22 DIAGNOSIS — D500 Iron deficiency anemia secondary to blood loss (chronic): Secondary | ICD-10-CM

## 2018-12-22 DIAGNOSIS — D72829 Elevated white blood cell count, unspecified: Secondary | ICD-10-CM

## 2018-12-22 DIAGNOSIS — S12600A Unspecified displaced fracture of seventh cervical vertebra, initial encounter for closed fracture: Secondary | ICD-10-CM

## 2018-12-22 DIAGNOSIS — D72823 Leukemoid reaction: Secondary | ICD-10-CM

## 2018-12-22 DIAGNOSIS — I824Z2 Acute embolism and thrombosis of unspecified deep veins of left distal lower extremity: Secondary | ICD-10-CM

## 2018-12-22 DIAGNOSIS — G8918 Other acute postprocedural pain: Secondary | ICD-10-CM
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
## 2018-12-22 NOTE — Plan of Care (Signed)
  Problem: RH Balance Goal: LTG: Patient will maintain dynamic sitting balance (OT) Description: LTG:  Patient will maintain dynamic sitting balance with assistance during activities of daily living (OT) Flowsheets (Taken 12/22/2018 1615) LTG: Pt will maintain dynamic sitting balance during ADLs with: Independent with assistive device Goal: LTG Patient will maintain dynamic standing with ADLs (OT) Description: LTG:  Patient will maintain dynamic standing balance with assist during activities of daily living (OT)  Flowsheets (Taken 12/22/2018 1615) LTG: Pt will maintain dynamic standing balance during ADLs with: Independent with assistive device   Problem: Sit to Stand Goal: LTG:  Patient will perform sit to stand in prep for activites of daily living with assistance level (OT) Description: LTG:  Patient will perform sit to stand in prep for activites of daily living with assistance level (OT) Flowsheets (Taken 12/22/2018 1615) LTG: PT will perform sit to stand in prep for activites of daily living with assistance level: Independent with assistive device   Problem: RH Grooming Goal: LTG Patient will perform grooming w/assist,cues/equip (OT) Description: LTG: Patient will perform grooming with assist, with/without cues using equipment (OT) Flowsheets (Taken 12/22/2018 1615) LTG: Pt will perform grooming with assistance level of: Independent with assistive device    Problem: RH Bathing Goal: LTG Patient will bathe all body parts with assist levels (OT) Description: LTG: Patient will bathe all body parts with assist levels (OT) Flowsheets (Taken 12/22/2018 1615) LTG: Pt will perform bathing with assistance level/cueing: Set up assist    Problem: RH Dressing Goal: LTG Patient will perform upper body dressing (OT) Description: LTG Patient will perform upper body dressing with assist, with/without cues (OT). Flowsheets (Taken 12/22/2018 1615) LTG: Pt will perform upper body dressing with  assistance level of: Independent with assistive device Goal: LTG Patient will perform lower body dressing w/assist (OT) Description: LTG: Patient will perform lower body dressing with assist, with/without cues in positioning using equipment (OT) Flowsheets (Taken 12/22/2018 1615) LTG: Pt will perform lower body dressing with assistance level of: Independent with assistive device   Problem: RH Toileting Goal: LTG Patient will perform toileting task (3/3 steps) with assistance level (OT) Description: LTG: Patient will perform toileting task (3/3 steps) with assistance level (OT)  Flowsheets (Taken 12/22/2018 1615) LTG: Pt will perform toileting task (3/3 steps) with assistance level: Independent with assistive device   Problem: RH Toilet Transfers Goal: LTG Patient will perform toilet transfers w/assist (OT) Description: LTG: Patient will perform toilet transfers with assist, with/without cues using equipment (OT) Flowsheets (Taken 12/22/2018 1615) LTG: Pt will perform toilet transfers with assistance level of: Independent with assistive device   Problem: RH Tub/Shower Transfers Goal: LTG Patient will perform tub/shower transfers w/assist (OT) Description: LTG: Patient will perform tub/shower transfers with assist, with/without cues using equipment (OT) Flowsheets (Taken 12/22/2018 1615) LTG: Pt will perform tub/shower stall transfers with assistance level of: Set up assist
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
## 2018-12-23 ENCOUNTER — Inpatient Hospital Stay (HOSPITAL_COMMUNITY): Payer: Medicare PPO

## 2018-12-23 DIAGNOSIS — M7989 Other specified soft tissue disorders: Secondary | ICD-10-CM

## 2018-12-23 DIAGNOSIS — I82409 Acute embolism and thrombosis of unspecified deep veins of unspecified lower extremity: Secondary | ICD-10-CM

## 2018-12-23 NOTE — Progress Notes (Signed)
RN notified of Doppler results. Pt positive for DVT in Left Calf. MD notified via phone and bed rest was ordered. SCD's discontinued and pt on bedrest at this time.
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
## 2018-12-23 NOTE — Progress Notes (Signed)
VASCULAR LAB PRELIMINARY  PRELIMINARY  PRELIMINARY  PRELIMINARY  Bilateral lower extremity venous duplex completed.    Preliminary report:  See CV proc for preliminary results.  Gave Dr. Posey Pronto and Vikki Ports, RN, critical results.   Pearley Baranek, RVT 12/23/2018, 11:17 AM

## 2018-12-24 ENCOUNTER — Inpatient Hospital Stay (HOSPITAL_COMMUNITY): Payer: Medicare PPO | Admitting: Physical Therapy

## 2018-12-24 ENCOUNTER — Inpatient Hospital Stay (HOSPITAL_COMMUNITY): Payer: Medicare PPO

## 2018-12-24 ENCOUNTER — Inpatient Hospital Stay (HOSPITAL_COMMUNITY): Payer: Medicare PPO | Admitting: Speech Pathology

## 2018-12-24 LAB — COMPREHENSIVE METABOLIC PANEL
ALT: 27 U/L (ref 0–44)
AST: 26 U/L (ref 15–41)
Albumin: 2.7 g/dL — ABNORMAL LOW (ref 3.5–5.0)
Alkaline Phosphatase: 123 U/L (ref 38–126)
Anion gap: 8 (ref 5–15)
BUN: 11 mg/dL (ref 8–23)
CO2: 24 mmol/L (ref 22–32)
Calcium: 8.6 mg/dL — ABNORMAL LOW (ref 8.9–10.3)
Chloride: 104 mmol/L (ref 98–111)
Creatinine, Ser: 0.82 mg/dL (ref 0.61–1.24)
GFR calc Af Amer: >60 mL/min (ref >60)
GFR calc non Af Amer: >60 mL/min (ref >60)
Glucose, Bld: 150 mg/dL — ABNORMAL HIGH (ref 70–99)
Potassium: 3.9 mmol/L (ref 3.5–5.1)
Sodium: 136 mmol/L (ref 135–145)
Total Bilirubin: 0.7 mg/dL (ref 0.3–1.2)
Total Protein: 7.2 g/dL (ref 6.5–8.1)

## 2018-12-24 LAB — CBC WITH DIFFERENTIAL/PLATELET
Abs Immature Granulocytes: 0.12 10*3/uL — ABNORMAL HIGH (ref 0.00–0.07)
Basophils Absolute: 0 10*3/uL (ref 0.0–0.1)
Basophils Relative: 0 %
Eosinophils Absolute: 0.2 10*3/uL (ref 0.0–0.5)
Eosinophils Relative: 2 %
HCT: 33 % — ABNORMAL LOW (ref 39.0–52.0)
Hemoglobin: 10.4 g/dL — ABNORMAL LOW (ref 13.0–17.0)
Immature Granulocytes: 1 %
Lymphocytes Relative: 16 %
Lymphs Abs: 1.6 10*3/uL (ref 0.7–4.0)
MCH: 29.4 pg (ref 26.0–34.0)
MCHC: 31.5 g/dL (ref 30.0–36.0)
MCV: 93.2 fL (ref 80.0–100.0)
Monocytes Absolute: 0.6 10*3/uL (ref 0.1–1.0)
Monocytes Relative: 7 %
Neutro Abs: 7.3 10*3/uL (ref 1.7–7.7)
Neutrophils Relative %: 74 %
Platelets: 327 10*3/uL (ref 150–400)
RBC: 3.54 MIL/uL — ABNORMAL LOW (ref 4.22–5.81)
RDW: 13.8 % (ref 11.5–15.5)
WBC: 9.8 10*3/uL (ref 4.0–10.5)
nRBC: 0 % (ref 0.0–0.2)

## 2018-12-24 MED ORDER — ENOXAPARIN SODIUM 30 MG/0.3ML ~~LOC~~ SOLN
30.0000 mg | Freq: Two times a day (BID) | SUBCUTANEOUS | Status: DC
  Administered 2018-12-24 – 2019-01-01 (×17): 30 mg via SUBCUTANEOUS
  Filled 2018-12-24 (×17): qty 0.3

## 2018-12-24 NOTE — Progress Notes (Signed)
Inpatient Rehabilitation  Patient information reviewed and entered into eRehab system by Akia Desroches M. Nura Cahoon, M.A., CCC/SLP, PPS Coordinator.  Information including medical coding, functional ability and quality indicators will be reviewed and updated through discharge.    

## 2018-12-24 NOTE — Care Management (Signed)
Inpatient Rehabilitation Center Individual Statement of Services  Patient Name:  Ian Watson  Date:  12/24/2018  Welcome to the [facility].  Our goal is to provide you with an individualized program based on your diagnosis and situation, designed to meet your specific needs.  With this comprehensive rehabilitation program, you will be expected to participate in at least 3 hours of rehabilitation therapies Monday-Friday, with modified therapy programming on the weekends.  Your rehabilitation program will include the following services:  Physical Therapy (PT), Occupational Therapy (OT), Speech Therapy (ST), 24 hour per day rehabilitation nursing, Therapeutic Recreaction (TR), Case Management (Social Worker), Rehabilitation Medicine, Nutrition Services and Pharmacy Services  Weekly team conferences will be held on Tuesdays to discuss your progress.  Your Social Worker will talk with you frequently to get your input and to update you on team discussions.  Team conferences with you and your family in attendance may also be held.  Expected length of stay: 7-10 days   Overall anticipated outcome: supervision/ modified independent  Depending on your progress and recovery, your program may change. Your Social Worker will coordinate services and will keep you informed of any changes. Your Social Worker's name and contact numbers are listed  below.  The following services may also be recommended but are not provided by the [facility] will be made to provide these services after discharge if needed.  Arrangements include referral to agencies that provide these services.  Your insurance has been verified to be:  [employer] Your primary doctor is:  [place]  Pertinent information will be shared with your doctor and your insurance  company.  Social Worker:  [place], [place] or ([phone]   Information discussed with and copy given to patient by: Lennart Pall, 12/24/2018, 4:15 PM

## 2018-12-24 NOTE — Progress Notes (Signed)
Responded to Spiritual care consult for an AD. No one was in room. Please resubmit consult of PT requests. Chaplain available as needed.  Chaplain Fidel Levy [phone]

## 2018-12-24 NOTE — IPOC Note (Signed)
Overall Plan of Care [facility](IPOC) Patient Details Name: Ian MedinaClyde Watson MRN: [id number] DOB: 03/17/1951  Admitting Diagnosis: <principal problem not specified>  Hospital Problems: Active Problems:   TBI (traumatic brain injury) (HCC)   Hyponatremia   Leukocytosis   Post-operative pain   Deep venous thrombosis (HCC)     Functional Problem List: Nursing Bladder, Edema, Endurance, Medication Management, Motor, Pain, Safety  PT Balance, Pain, Behavior, Safety, Endurance, Motor  OT Balance, Endurance, Motor, Pain, Safety  SLP Cognition  TR         Basic ADL's: OT Grooming, Bathing, Dressing, Toileting     Advanced  ADL's: OT Light Housekeeping     Transfers: PT Bed Mobility, Bed to Chair, Car, Occupational psychologisturniture  OT Toilet, Research scientist (life sciences)Tub/Shower     Locomotion: PT Ambulation, Stairs     Additional Impairments: OT Fuctional Use of Upper Extremity  SLP Social Cognition   Problem Solving, Memory  TR      Anticipated Outcomes Item Anticipated Outcome  Self Feeding No goal  Swallowing      Basic self-care  Supervision/setup-Mod I  Toileting  Mod I   Bathroom Transfers Supervision/setup-Mod I  Bowel/Bladder  remain continent of bowel and bladder, maintain regular pattern of emptying  Transfers  supervision with LRAD  Locomotion  supervision with LRAD  Communication     Cognition  mod I  Pain  Less than 4  Safety/Judgment  Free of falls, skin breakdown and infection while on rehab   Therapy Plan: PT Intensity: Minimum of 1-2 x/day ,45 to 90 minutes PT Frequency: 5 out of 7 days PT Duration Estimated Length of Stay: 7-10 days OT Intensity: Minimum of 1-2 x/day, 45 to 90 minutes OT Frequency: 5 out of 7 days OT Duration/Estimated Length of Stay: 7-10 days SLP Intensity: Minumum of 1-2 x/day, 30 to 90 minutes SLP Frequency: 3 to 5 out of 7 days SLP Duration/Estimated Length of Stay: 10-14 days   Due to the current state of emergency, patients may not be receiving their 3-hours of  Medicare-mandated therapy.   Team Interventions: Nursing Interventions Patient/Family Education, Bladder Management, Pain Management, Skin Care/Wound Management, Medication Management, Discharge Planning, Psychosocial Support  PT interventions Ambulation/gait training, Cognitive remediation/compensation, Discharge planning, DME/adaptive equipment instruction, Functional mobility training, Pain management, Psychosocial support, Therapeutic Activities, Splinting/orthotics, UE/LE Strength taining/ROM, Visual/perceptual remediation/compensation, UE/LE Coordination activities, Therapeutic Exercise, Stair training, Skin care/wound management, Patient/family education, Neuromuscular re-education, Functional electrical stimulation, Disease management/prevention, FirefighterCommunity reintegration, Warden/rangerBalance/vestibular training  OT Interventions Warden/rangerBalance/vestibular training, Fish farm managerDME/adaptive equipment instruction, Patient/family education, Therapeutic Activities, Wheelchair propulsion/positioning, Therapeutic Exercise, Psychosocial support, Community reintegration, Functional mobility training, Self Care/advanced ADL retraining, UE/LE Strength taining/ROM, UE/LE Coordination activities, Discharge planning, Disease mangement/prevention, Pain management, Visual/perceptual remediation/compensation  SLP Interventions Cognitive remediation/compensation, Cueing hierarchy, Internal/external aids, Patient/family education, Environmental controls  TR Interventions    SW/CM Interventions Discharge Planning, Psychosocial Support, Patient/Family Education   Barriers to Discharge MD  Medical stability  Nursing      PT Incontinence    OT Medical stability    SLP      SW       Team Discharge Planning: Destination: PT-Home ,OT-   , SLP-Home Projected Follow-up: PT-Home health PT, OT-  Home health OT, SLP-None Projected Equipment Needs: PT-Rolling walker with 5" wheels, OT- To be determined, SLP-None recommended by SLP Equipment  Details: PT- , OT-  Patient/family involved in discharge planning: PT- Patient,  OT-Patient, SLP-Patient  MD ELOS: 10-11 days Medical Rehab Prognosis:  Excellent Assessment: The patient has been admitted for  CIR therapies with the diagnosis of TBI, cervical fxs. The team will be addressing functional mobility, strength, stamina, balance, safety, adaptive techniques and equipment, self-care, bowel and bladder mgt, patient and caregiver education, NMR, cognition, communication, community reentry. Goals have been set at mod I to supervision with self-care, supervision with transfers and mobility and mod I with cognition.   Due to the current state of emergency, patients may not be receiving their 3 hours per day of Medicare-mandated therapy.    Meredith Staggers, MD, FAAPMR      See Team Conference Notes for weekly updates to the plan of care

## 2018-12-24 NOTE — Progress Notes (Signed)
Speech Language Pathology Discharge Summary  Patient Details  Name: Ian Watson MRN: [id number] Date of Birth: April 14, 1951  Today's Date: 12/24/2018 SLP Individual Time: 0830-0930 SLP Individual Time Calculation (min): 60 min   Skilled Therapeutic Interventions:  Skilled treatment session focused on cognition goals. SLP facilitated session by providing higher level problem solving task as per POC. Despite pt's best attempts, he was not able to attempt task. Per pt report, he didn't complete higher level cognitive tasks at baseline. His family "took care of everything." given different functional sceneries, pt continued to provide examples of how family provided for each sceneries.  At this time, pt appears at functional baseline.     Patient has met 2 of 2 long term goals.  Patient to discharge at overall Modified Independent level.  Reasons goals not met:   N/A  Clinical Impression/Discharge Summary:    Despite previously documented higher level deficits, pt didn't perform higher level tasks at baseline, therefore he wasn't able to complete such task within recent therapy session. Additionally, SLP spoke with interdisciplinary team who don't report any cognitive deficits. At this time, no acute deficits are identified and ST to sign off.   Care Partner:  Caregiver Able to Provide Assistance: Yes  Type of Caregiver Assistance: Physical  Recommendation:  None      Equipment:   N/A  Reasons for discharge: Treatment goals met   Patient/Family Agrees with Progress Made and Goals Achieved: Yes    [name] 12/24/2018, 12:16 PM
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
## 2018-12-24 NOTE — Progress Notes (Signed)
Physical Therapy Session Note  Patient Details  Name: Ian Watson MRN: [id number] Date of Birth: 04/30/1951  Today's Date: 12/24/2018 PT Individual Time: 1418-1530 PT Individual Time Calculation (min): 72 min   Short Term Goals: Week 1:  PT Short Term Goal 1 (Week 1): STG = LTG due to short ELOS.  Skilled Therapeutic Interventions/Progress Updates:  MD cleared pt for participation in therapy without any restrictions noted even in the setting of newly diagnosed LLE gastroc vein DVT.   Pt received in w/c & agreeable to tx. Transported pt to gym via w/c dependent assist where pt transfers sit<>stand with RW & min assist and ambulates 50 ft + 50 ft with RW & light min assist with pt demonstrating decreased hip/knee flexion RLE during swing phase & impaired heel strike BLE with pt reporting slightly more pain in RLE compared to LLE. Pt performed the following BLE standing exercises for strengthening with instructional cuing & demo for technique: hip/knee flexion marches, mini squats, and heel raises, all 2 sets x 10 reps. Pt negotiates 4 steps (6") with B rails and min assist with cuing for compensatory pattern and extra time 2/2 pt moving slowly due to soreness. Pt requests to propel w/c and does so gym>room with BUE & supervision for cardiopulmonary endurance training & BUE strengthening. Pt performs 5x sit<>stand with min assist & BUE support with task focusing on BLE strengthening. At end of session pt left in w/c with chair alarm donned & call bell in reach.   Pt very motivated to participate in therapy. Pt able to recall neck precautions & need to wear neck brace at all times without assistance from therapist.   Therapy Documentation Precautions:  Precautions Precautions: Fall, Cervical Required Braces or Orthoses: Cervical Brace Cervical Brace: Hard collar, At all times Restrictions Weight Bearing Restrictions: Yes RLE Weight Bearing: Weight bearing as tolerated LLE Weight Bearing:  Weight bearing as tolerated  Pain: Pt reports 7-8/10 soreness in R ribs & pain meds requested & administered during session & rest breaks provided PRN.  Therapy/Group: Individual Therapy  [name] 12/24/2018, 3:33 PM

## 2018-12-24 NOTE — Progress Notes (Signed)
Occupational Therapy Session Note  Patient Details  Name: Ian Watson MRN: [id number] Date of Birth: 1951/06/13  Today's Date: 12/24/2018 OT Individual Time: 8341-9622 OT Individual Time Calculation (min): 55 min    Short Term Goals: Week 1:  OT Short Term Goal 1 (Week 1): STGs=LTGs due to ELOS  Skilled Therapeutic Interventions/Progress Updates:    1;1. Verbal order for removal of bed rest order. Pt supine> sitting EOB with MIN A for LLE movement to EOB. Pt completes SPT with RW and MIN A to w/c. Pt completes bathing seated in w/c with A to wash B feet/calves and buttocks. Pt requires A to doff shirt over head and don overhead. Pt reports need to toilet with MIN A overall for CM and A for thorough hygiene. Pt washes B thighs seated in w/c after BM. OT provides reacher and sock aide to pt and educates on AE use. Pt able to thread LLE into pants and advance pants past hips with MIN A. OT educates on next time threading RLE into pants d/t arthritis limiting mobility. D/t lack of wide sock aide, OT dons B socks dependently. Exited session with pt seated in w/c, call light inr each and exit alarm on  Therapy Documentation Precautions:  Precautions Precautions: Fall, Cervical Required Braces or Orthoses: Cervical Brace Cervical Brace: Hard collar, At all times Restrictions Weight Bearing Restrictions: Yes RLE Weight Bearing: Weight bearing as tolerated LLE Weight Bearing: Weight bearing as tolerated General:   Vital Signs:  Pain: Pain Assessment Pain Scale: 0-10 Pain Score: 0-No pain ADL: ADL Eating: Not assessed Grooming: Setup Where Assessed-Grooming: Sitting at sink Upper Body Bathing: Supervision/safety Where Assessed-Upper Body Bathing: Sitting at sink Lower Body Bathing: Moderate cueing Where Assessed-Lower Body Bathing: Standing at sink, Sitting at sink Upper Body Dressing: Moderate assistance Where Assessed-Upper Body Dressing: Sitting at sink Lower Body Dressing:  Maximal assistance Where Assessed-Lower Body Dressing: Sitting at sink, Standing at sink Toileting: Maximal assistance Where Assessed-Toileting: Glass blower/designer: Psychiatric nurse Method: Ambulating(RW) Science writer: Energy manager: Not assessed Vision   Perception    Praxis   Exercises:   Other Treatments:     Therapy/Group: Individual Therapy  Tonny Branch 12/24/2018, 10:51 AM

## 2018-12-25 ENCOUNTER — Inpatient Hospital Stay (HOSPITAL_COMMUNITY): Payer: Medicare PPO | Admitting: Physical Therapy

## 2018-12-25 ENCOUNTER — Inpatient Hospital Stay (HOSPITAL_COMMUNITY): Payer: Medicare PPO

## 2018-12-25 DIAGNOSIS — S069X0A Unspecified intracranial injury without loss of consciousness, initial encounter: Secondary | ICD-10-CM

## 2018-12-25 NOTE — Progress Notes (Signed)
Physical Therapy Session Note  Patient Details  Name: Ian Watson MRN: [id number] Date of Birth: 12-Dec-1950  Today's Date: 12/25/2018 PT Individual Time: 1400-1425 and 5784-6962 PT Individual Time Calculation (min): 25 min and 30 min  Short Term Goals: Week 1:  PT Short Term Goal 1 (Week 1): STG = LTG due to short ELOS.  Skilled Therapeutic Interventions/Progress Updates:  Treatment 1: Pt received in w/c & agreeable to tx. Pt reports 7/10 soreness in ribs but reports he just received pain medication. Pt transfers sit<>stand with CGA with good awareness of proper hand placement. Pt ambulates 86 ft + 86 ft with CGA with decreased gait speed, slight forward trunk flexion & BUE reliance on RW, decreased hip/knee flexion during swing phase BLE (R less ROM than LLE) and decreased heel strike BLE with some improvement with cuing for gait pattern. Assisted pt with calling wife to allow pt to ask her to bring in some tennis shoes & clothing for him to have while in CIR but did not reach her & unable to leave her a voicemail. At end of session pt left in w/c with chair alarm donned & all needs in reach.   Treatment 2: Pt received in w/c & agreeable to tx. Pt reports ongoing 7/10 soreness in ribs & rest breaks provided PRN during session. Transported pt to gym via w/c dependent assist for time management. Provided demonstration & instructional cuing for negotiating stairs laterally with R ascending rail. Pt completes stair negotiation x 4 steps (6") in this manner with max cuing for technique and to ensure space on each step for BLE, and min assist overall. Pt transfers sit<>stand with CGA and ambulates 105 ft with RW & CGA with decreased gait speed, BUE reliance on RW, and decreased R hip/knee flexion during swing phase and minimal BLE heel strike. Pt propels w/c remainder of distance back to room with BUE & supervision for BUE strengthening & cardiopulmonary endurance training. Pt performs BLE hip adduction  pillow squeezes with instructional cuing for technique for BLE strengthening. Pt left sitting in w/c with chair alarm donned & all needs in reach.    Therapy Documentation Precautions:  Precautions Precautions: Fall, Cervical Required Braces or Orthoses: Cervical Brace Cervical Brace: Hard collar, At all times Restrictions Weight Bearing Restrictions: Yes RLE Weight Bearing: Weight bearing as tolerated LLE Weight Bearing: Weight bearing as tolerated    Therapy/Group: Individual Therapy  [name] 12/25/2018, 3:28 PM
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
## 2018-12-25 NOTE — Progress Notes (Signed)
Occupational Therapy Session Note  Patient Details  Name: Ian Watson MRN: [id number] Date of Birth: July 25, 1950  Today's Date: 12/25/2018 OT Individual Time: 5009-3818 OT Individual Time Calculation (min): 71 min    Short Term Goals: Week 1:  OT Short Term Goal 1 (Week 1): STGs=LTGs due to ELOS  Skilled Therapeutic Interventions/Progress Updates:    1;1. Pt received in bed with RN delivering medicaiton. Pain in ribs reported and premedicated/PRN rest provided. Pt reports need to toilet. MAX A bed mobility with HOB elevated. Pt completes min A ambulatory transfer throughout session to toilet and w/c with RW and VC for larger step through pattern. Pt toilets with A for hygiene in standing for thoroughness after BM. Pt complete bathing and dressing seated in w/c at sink without A to thread head into shirt and VC for using reacher to thread RLE into pants first d/t arthritis. Total A provided for socks d/t no wide sock aide available. Oral care completed seated at sink with set up. Pt completes simulated walk in shower transfer stepping over 4" edge with posterior method after demo from OT with MOD A to stand up from shower chair. Educated on use of BSC in shower to have arm rests to push up into standing for safety. Pt ambulates ~50 feet towards room with min A for sit>stand and supervision for ambulation once standing. Exited session with pt seated in w/c, call light in reach and exit alarm on    Therapy Documentation Precautions:  Precautions Precautions: Fall, Cervical Required Braces or Orthoses: Cervical Brace Cervical Brace: Hard collar, At all times Restrictions Weight Bearing Restrictions: Yes RLE Weight Bearing: Weight bearing as tolerated LLE Weight Bearing: Weight bearing as tolerated General:   Vital Signs: Therapy Vitals Temp: 99 F (37.2 C) Temp Source: Oral Pulse Rate: 80 Resp: 17 BP: 139/74 Patient Position (if appropriate): Lying Oxygen Therapy SpO2: 93 % O2  Device: Room Air Pain:   ADL: ADL Eating: Not assessed Grooming: Setup Where Assessed-Grooming: Sitting at sink Upper Body Bathing: Supervision/safety Where Assessed-Upper Body Bathing: Sitting at sink Lower Body Bathing: Moderate cueing Where Assessed-Lower Body Bathing: Standing at sink, Sitting at sink Upper Body Dressing: Moderate assistance Where Assessed-Upper Body Dressing: Sitting at sink Lower Body Dressing: Maximal assistance Where Assessed-Lower Body Dressing: Sitting at sink, Standing at sink Toileting: Maximal assistance Where Assessed-Toileting: Glass blower/designer: Psychiatric nurse Method: Ambulating(RW) Science writer: Energy manager: Not assessed Vision   Perception    Praxis   Exercises:   Other Treatments:     Therapy/Group: Individual Therapy  Tonny Branch 12/25/2018, 8:30 AM

## 2018-12-25 NOTE — Progress Notes (Addendum)
Physical Therapy Session Note  Patient Details  Name: Ian Watson MRN: [id number] Date of Birth: 05/18/1951  Today's Date: 12/25/2018 PT Individual Time: 1105-1202 PT Individual Time Calculation (min): 57 min   Short Term Goals: Week 1:  PT Short Term Goal 1 (Week 1): STG = LTG due to short ELOS.  Skilled Therapeutic Interventions/Progress Updates: Pt presented in w/c agreeable to therapy. Per pt 7/10 pain however currently unable to receive additional pain meds. Pt transported to day room and participated in gait training x 83 ft CGA. Pt noted to have decreased R knee flexion. PT transported remaining distance to rehab gym and participated in seated and standing therex as follows for BLE strengthening.  LAQ 2 x 10 Hip flexion/seated march 2 x 10 Hamstring curls level 2 resistance band 2 x 10  Hip er level 2 resistance band 2 x 10 Standing: SLR 2x 10 Hip abd/add 2 x 10 Hamstring curls 2 x 10 Heel rises 2 x 10  Pt returned to w/c and transported to day room. Pt participated in NuStep L3 BLE only for endurance and global conditioning. Pt required frequent rest breaks due to fatigue. Pt returned to w/c and returned to room. Pt left with chair alarm on, call bell within reach and needs met.       Therapy Documentation Precautions:  Precautions Precautions: Fall, Cervical Required Braces or Orthoses: Cervical Brace Cervical Brace: Hard collar, At all times Restrictions Weight Bearing Restrictions: Yes RLE Weight Bearing: Weight bearing as tolerated LLE Weight Bearing: Weight bearing as tolerated General:   Vital Signs:   Pain: Pain Assessment Pain Scale: 0-10 Pain Score: 0-No pain Mobility:   Locomotion :    Trunk/Postural Assessment :    Balance:   Exercises:   Other Treatments:      Therapy/Group: Individual Therapy  Vicki Chaffin 12/25/2018, 1:01 PM

## 2018-12-26 ENCOUNTER — Inpatient Hospital Stay (HOSPITAL_COMMUNITY): Payer: Medicare PPO | Admitting: Physical Therapy

## 2018-12-26 ENCOUNTER — Inpatient Hospital Stay (HOSPITAL_COMMUNITY): Payer: Medicare PPO

## 2018-12-26 NOTE — Progress Notes (Signed)
Physical Therapy Session Note  Patient Details  Name: Ian Watson MRN: [id number] Date of Birth: 1950-10-14  Today's Date: 12/26/2018 PT Individual Time: 1405-1500 PT Individual Time Calculation (min): 55 min   Short Term Goals: Week 1:  PT Short Term Goal 1 (Week 1): STG = LTG due to short ELOS.  Skilled Therapeutic Interventions/Progress Updates:  Pt received in w/c & agreeable to tx, reporting 9/10 soreness but states he's recently premedicated & rest breaks taken PRN during session. Pt reports they have ~3 mattresses on their bed at home so it is significantly elevated so he plans to sleep on the sofa at home despite therapist suggesting removing a box spring/mattress to lower their bed. Pt transfers sit<>stand with CGA & ambulates ~7 ft in room w/c<>bed with RW & CGA. Pt completes bed mobility with bed height set to simulate couch at home. Pt requires mod assist to transfer BLE onto bed for sit>supine 2/2 soreness with movement, and is able to transfer supine>sit with bed flat, no rails, with supervision & extra time. Therapist assists pt with donning tennis shoes for time management. Pt ambulates 100 ft with RW & CGA with forward trunk lean on RW, decreased R hip/knee flexion during swing phase, and cuing for increased foot clearance & heel strike BLE. Pt utilized cybex kinetron while sitting with increasing levels of resistance with task focusing on BLE strengthening (1 minute trials x 3). Pt ambulates back to room from dayroom with RW & CGA in same manner as noted above. Pt performs BLE long arc quads while seated in w/c with instructional cuing for technique & task focusing on BLE strengthening. At end of session pt left in w/c with chair alarm donned & all needs in reach.   Pt requires extra time & rest breaks throughout session 2/2 fatigue & soreness with all movement.  Discussed furniture transfers at home with pt demonstrating good awareness as he reports he will sit on taller sofa vs  lower love seat with therapist recommending this as well.   Therapy Documentation Precautions:  Precautions Precautions: Fall, Cervical Required Braces or Orthoses: Cervical Brace Cervical Brace: Hard collar, At all times Restrictions Weight Bearing Restrictions: Yes RLE Weight Bearing: Weight bearing as tolerated LLE Weight Bearing: Weight bearing as tolerated   Therapy/Group: Individual Therapy  Ian Watson 12/26/2018, 3:04 PM

## 2018-12-26 NOTE — Progress Notes (Signed)
Occupational Therapy Session Note  Patient Details  Name: Ian Watson MRN: [id number] Date of Birth: [birth year]  Today's Date: 12/26/2018 OT Individual Time: 1694-5038 OT Individual Time Calculation (min): 75 min    Short Term Goals: Week 1:  OT Short Term Goal 1 (Week 1): STGs=LTGs due to ELOS  Skilled Therapeutic Interventions/Progress Updates:    Pt received supine requesting shower. Pt completed bed mobility to EOB with max A, mod cueing for UE placement and log rolling technique to adhere to cervical precautions. Pt reporting c/o 7/10 pain in R ribs during bed mobility described as sore. Pain was alleviated with rest break sitting EOB. Pt completed sit > stand with RW, good hand placement, with min A. Pt completed ambulatory transfer into bathroom with CGA and transferred onto [facility] over toilet. Pt's PICC line was occluded with Seal Skin prior to shower. Pt transferred into shower with min cueing for safety. Pt washed UB and peri areas with set up assist, and required assistance for reaching lower legs and feet. Pt transitioned back to supine in bed, with mod A for lifting BLE. Pt rolled R and L for C collar pads to be changed. Edu provided throughout on carryover of doing this at home. Pt transitioned back to EOB with only mod A this time, HOB more flat. Pt donned shirt with set up assist EOB. Pt able to thread LLE through pants without AD and then required reacher for R LE to thread through pants. Pt stood with CGA and pulled pants up. Pt transitioned into w/c and completed oral care at sink. Pt stood at sink and washed cervical collar pads with edu provided re care. Pt walked to window sill and placed pads to dry. Pt was left sitting up with all needs met in his w/c, chair pad alarm set.   Therapy Documentation Precautions:  Precautions Precautions: Fall, Cervical Required Braces or Orthoses: Cervical Brace Cervical Brace: Hard collar, At all times Restrictions Weight Bearing  Restrictions: Yes RLE Weight Bearing: Weight bearing as tolerated LLE Weight Bearing: Weight bearing as tolerated   Therapy/Group: Individual Therapy  Curtis Sites 12/26/2018, 7:09 AM

## 2018-12-26 NOTE — Progress Notes (Signed)
Occupational Therapy Session Note  Patient Details  Name: Ian Watson MRN: [id number] Date of Birth: [birth year]  Today's Date: 12/26/2018 OT Individual Time: 1105-1200 OT Individual Time Calculation (min): 55 min    Short Term Goals: Week 1:  OT Short Term Goal 1 (Week 1): STGs=LTGs due to ELOS  Skilled Therapeutic Interventions/Progress Updates:    1;1. Pt received in w/c with no c/o pain. Pt completes w/c propulsion part way to tx gym with superviison. Pt completes standing bean bag toss with RW with supervision and gathering after each round with reacher. OT discusses energy conservation and home set up rearragnging needed items to waist-shoulder height. Pt ambulates ~40 feet with RW and Vc for relaxing shoulders. Pt completes standing wii bowling activity with supervision and mod VC for remote control. Exited session with pt seated in w/c, exit alarm on and call light in reach  Therapy Documentation Precautions:  Precautions Precautions: Fall, Cervical Required Braces or Orthoses: Cervical Brace Cervical Brace: Hard collar, At all times Restrictions Weight Bearing Restrictions: Yes RLE Weight Bearing: Weight bearing as tolerated LLE Weight Bearing: Weight bearing as tolerated General:   Vital Signs:  Pain: Pain Assessment Pain Scale: 0-10 Pain Score: 0-No pain ADL: ADL Eating: Not assessed Grooming: Setup Where Assessed-Grooming: Sitting at sink Upper Body Bathing: Supervision/safety Where Assessed-Upper Body Bathing: Sitting at sink Lower Body Bathing: Moderate cueing Where Assessed-Lower Body Bathing: Standing at sink, Sitting at sink Upper Body Dressing: Moderate assistance Where Assessed-Upper Body Dressing: Sitting at sink Lower Body Dressing: Maximal assistance Where Assessed-Lower Body Dressing: Sitting at sink, Standing at sink Toileting: Maximal assistance Where Assessed-Toileting: Glass blower/designer: Psychiatric nurse Method:  Ambulating(RW) Science writer: Energy manager: Not assessed Vision   Perception    Praxis   Exercises:   Other Treatments:     Therapy/Group: Individual Therapy  Tonny Branch 12/26/2018, 11:59 AM

## 2018-12-26 NOTE — Progress Notes (Signed)
Physical Therapy Weekly Progress Note  Patient Details  Name: Ian Watson MRN: [id number] Date of Birth: June 17, 1950  Beginning of progress report period: December 22, 2018 End of progress report period: December 26, 2018  Today's Date: 12/26/2018  Pt is making good progress towards all LTG's as he is currently able to ambulate ~85 ft with RW & CGA and complete transfers with CGA. Pt continues to require min assist for stair negotiation and assistance for bed mobility as he is limited by soreness for all mobility. Pt would benefit from continued skilled PT treatment to focus on endurance & strengthening, balance, and activity tolerance, as well as stair negotiation to ensure safe d/c.   Patient continues to demonstrate the following deficits muscle weakness, decreased cardiorespiratoy endurance, and decreased standing balance, decreased postural control, decreased balance strategies and difficulty maintaining precautions and therefore will continue to benefit from skilled PT intervention to increase functional independence with mobility.  Patient progressing toward long term goals..  Continue plan of care.  PT Short Term Goals Week 1:  PT Short Term Goal 1 (Week 1): STG = LTG due to short ELOS. Week 2:  PT Short Term Goal 1 (Week 2): STG = LTG due to short ELOS.  Therapy Documentation Precautions:  Precautions Precautions: Fall, Cervical Required Braces or Orthoses: Cervical Brace Cervical Brace: Hard collar, At all times Restrictions Weight Bearing Restrictions: Yes RLE Weight Bearing: Weight bearing as tolerated LLE Weight Bearing: Weight bearing as tolerated    Therapy/Group: Individual Therapy  [name] 12/26/2018, 12:07 PM
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
## 2018-12-27 ENCOUNTER — Inpatient Hospital Stay (HOSPITAL_COMMUNITY): Payer: Medicare PPO | Admitting: Physical Therapy

## 2018-12-27 ENCOUNTER — Inpatient Hospital Stay (HOSPITAL_COMMUNITY): Payer: Medicare PPO

## 2018-12-27 ENCOUNTER — Inpatient Hospital Stay (HOSPITAL_COMMUNITY): Payer: Medicare PPO | Admitting: Occupational Therapy
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
## 2018-12-27 NOTE — Progress Notes (Signed)
Occupational Therapy Session Note  Patient Details  Name: Ian Watson MRN: [id number] Date of Birth: 12-15-1950  Today's Date: 12/27/2018 OT Individual Time: 1030-1100 OT Individual Time Calculation (min): 30 min    Short Term Goals: Week 1:  OT Short Term Goal 1 (Week 1): STGs=LTGs due to ELOS  Skilled Therapeutic Interventions/Progress Updates:    1:1 Therapeutic activity with focus on activity tolerance, standing balance, endurance, functional mobility etc. Pt performed stepping on  Kinetron with medium resistance in standing with Ue support and then progressing to no UE support. Able to perform 2 straight minutes of standing and stepping before rest break performed 3x.  Ambulated with focus on stepping over 4 different thresholds with focus on safe RW management. Pt able to perform crossing 8 thresholds with close supervision. Pt then ambulated back to his room from the dayroom with close supervision with RW. Left resting in the recliner with call bell.   Therapy Documentation Precautions:  Precautions Precautions: Fall, Cervical Required Braces or Orthoses: Cervical Brace Cervical Brace: Hard collar, At all times Restrictions Weight Bearing Restrictions: Yes RLE Weight Bearing: Weight bearing as tolerated LLE Weight Bearing: Weight bearing as tolerated Pain: Pain Assessment Pain Scale: 0-10 Pain Score: 0-No pain   Therapy/Group: Individual Therapy  Willeen Cass [facility] 12/27/2018, 1:33 PM

## 2018-12-27 NOTE — Progress Notes (Signed)
Social Work Patient ID: Ian Watson, male   DOB: July 05, 1950, 68 y.o.   MRN: [id number]  Have reviewed team conf with pt who is aware and VERY agreeable with targeted d/c date of 7/21 and supervision goals overall.  Continue to follow.  Sanjana Folz, LCSW

## 2018-12-27 NOTE — Progress Notes (Signed)
Occupational Therapy Session Note  Patient Details  Name: Ian Watson MRN: [id number] Date of Birth: [birth year]  Today's Date: 12/27/2018 OT Individual Time: 8916-9450 OT Individual Time Calculation (min): 54 min    Short Term Goals: Week 1:  OT Short Term Goal 1 (Week 1): STGs=LTGs due to ELOS  Skilled Therapeutic Interventions/Progress Updates:    1:1. Pt seated on toilet with RN present. Pt finishes BM and doffs pants with reacher and VC. Pt able to sit to stand and cleanse buttocks with CGA and transfer into TTB with RW. Pt completes bathing sit to stand with VC for grab bar use while cleansing peri are. Pt dressing with S for clothing using reacher to don pants and mod VC for AE technique. OT changes C collar pads and pt dons shirt with S. Pt completes donning L shoe after education on reahcer/shoe funnel technqiue. Pt requires min A to don R shoe, however max A overall for donning all footwear. Exited session with pt seated in w/c, call light in reach and all needs met  Therapy Documentation Precautions:  Precautions Precautions: Fall, Cervical Required Braces or Orthoses: Cervical Brace Cervical Brace: Hard collar, At all times Restrictions Weight Bearing Restrictions: Yes RLE Weight Bearing: Weight bearing as tolerated LLE Weight Bearing: Weight bearing as tolerated General:   Vital Signs: Therapy Vitals Temp: 97.7 F (36.5 C) Pulse Rate: 97 Resp: (!) 21 BP: (!) 147/70 Patient Position (if appropriate): Sitting Oxygen Therapy SpO2: 99 % O2 Device: Room Air Pain:   ADL: ADL Eating: Not assessed Grooming: Setup Where Assessed-Grooming: Sitting at sink Upper Body Bathing: Supervision/safety Where Assessed-Upper Body Bathing: Sitting at sink Lower Body Bathing: Moderate cueing Where Assessed-Lower Body Bathing: Standing at sink, Sitting at sink Upper Body Dressing: Moderate assistance Where Assessed-Upper Body Dressing: Sitting at sink Lower Body Dressing:  Maximal assistance Where Assessed-Lower Body Dressing: Sitting at sink, Standing at sink Toileting: Maximal assistance Where Assessed-Toileting: Glass blower/designer: Psychiatric nurse Method: Ambulating(RW) Science writer: Energy manager: Not assessed Vision   Perception    Praxis   Exercises:   Other Treatments:     Therapy/Group: Individual Therapy  Tonny Branch 12/27/2018, 2:51 PM
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
## 2018-12-27 NOTE — Progress Notes (Signed)
Physical Therapy Session Note  Patient Details  Name: Ian Watson MRN: [id number] Date of Birth: 09/18/1950  Today's Date: 12/27/2018 PT Individual Time: [id number] and [id number] PT Individual Time Calculation (min): 43 min and 62 min  Short Term Goals: Week 1:  PT Short Term Goal 1 (Week 1): STG = LTG due to short ELOS.  Skilled Therapeutic Interventions/Progress Updates:    Session 1: Pt received supine in bed and agreeable to therapy session. Pt wearing hard cervical collar for entire session. Reports he is continuing to have R side rib "soreness" - RN aware and present for medication administration. Pt able to recall that he is to wear the cervical collar at all times. Supine>sit, HOB partially elevated and heavy reliance on bedrails, with mod assist for LE management and trunk upright with mod cuing for sequencing of logroll technique. Sit<>stands using RW with CGA for steadying throughout session. Ambulated ~8513ft x2 to/from bathroom using RW with CGA. Performed standing LB clothing management, peri-care, and hand hygiene at sink all using RW with CGA for steadying while standing and set-up assist for washcloths. Pt continent of urine and BM. Performed standing oral hygiene at sink using RW with CGA/close supervision for steadying/safety. Ambulated 18522ft using RW with CGA - demonstrates heavy reliance on B UE support, decreased B LE foot clearance, and decreased gait speed. Transported back to room in w/c and pt left sitting in w/c with needs in reach and seat belt alarm on.  Session 2: Pt received sitting in w/c and agreeable to therapy session - wearing hard cervical collar for entire session.  Transported to/from gym in w/c. Sit<>stands using RW with CGA for steadying and min cuing for proper hand placement throughout session. Ambulated ~3555ft using RW with CGA for steadying and pt continuing to demonstrate heavy reliance on B UE support as well as slow gait speed, decreased B LE foot clearance,  and wide BOS. Ascended/descended 4 steps x2 using R HR with lateral side stepping - visual demonstration and education on proper technique/sequencing with min assist for balance and min cuing for leaving room on step for feet. Ambulated ~4123ft using RW with CGA for steadying and pt continuing to demonstrate the above gait deviations with cuing throughout for improvements. Repeated sit<>stands x6 elevated EOM<>no UE support with CGA for steadying and cuing for increased anterior trunk lean followed by B LE hip/knee extension - pt performs movement with very slow speed. Alternate B LE foot taps on 4" step using B UE support on RW and CGA for steadying - pt reverts back to relying heavily on B UE support during this task (especially during R LE stance phase) despite cuing for upright posture and increased LE weightbearing that improves with increased repetition - mirror feedback throughout for upright posture. Obstacle navigation ambulating around cones using RW with CGA for steadying and pt demonstrating safe AD management. Stand pivot EOM>w/c using RW with CGA/close supervision for safety. Transported back to room and therapist provided ice for rib pain - pt left sitting in w/c with needs in reach, seat belt alarm on, and meal tray set-up.  Therapy Documentation Precautions:  Precautions Precautions: Fall, Cervical Required Braces or Orthoses: Cervical Brace Cervical Brace: Hard collar, At all times Restrictions Weight Bearing Restrictions: Yes RLE Weight Bearing: Weight bearing as tolerated LLE Weight Bearing: Weight bearing as tolerated  Pain: Session 1: States "I don't have any pain.Marland Kitchen.it is just soreness." rates it as 7/10 primarily in right ribs - RN present for medication administration and  therapist provided seated rest breaks throughout for pain management.   Session 2: Continues to report "soreness" in right ribs and low back rated as 7/10 - therapist provided rest breaks throughout session and  ice at the end for pain management.    Therapy/Group: Individual Therapy  Tawana Scale, PT, DPT 12/27/2018, 7:53 AM

## 2018-12-28 ENCOUNTER — Inpatient Hospital Stay (HOSPITAL_COMMUNITY): Payer: Medicare PPO | Admitting: Occupational Therapy

## 2018-12-28 ENCOUNTER — Inpatient Hospital Stay (HOSPITAL_COMMUNITY): Payer: Medicare PPO | Admitting: Physical Therapy

## 2018-12-28 ENCOUNTER — Inpatient Hospital Stay (HOSPITAL_COMMUNITY): Payer: Medicare PPO | Admitting: *Deleted

## 2018-12-28 LAB — CBC
HCT: 31 % — ABNORMAL LOW (ref 39.0–52.0)
Hemoglobin: 9.5 g/dL — ABNORMAL LOW (ref 13.0–17.0)
MCH: 29.2 pg (ref 26.0–34.0)
MCHC: 30.6 g/dL (ref 30.0–36.0)
MCV: 95.4 fL (ref 80.0–100.0)
Platelets: 372 10*3/uL (ref 150–400)
RBC: 3.25 MIL/uL — ABNORMAL LOW (ref 4.22–5.81)
RDW: 13.9 % (ref 11.5–15.5)
WBC: 5.7 10*3/uL (ref 4.0–10.5)
nRBC: 0 % (ref 0.0–0.2)
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
## 2018-12-28 NOTE — Progress Notes (Signed)
Physical Therapy Session Note  Patient Details  Name: Ian Watson MRN: [id number] Date of Birth: Jan 28, 1951  Today's Date: 12/28/2018 PT Individual Time: 0805-0900 and 4132-4401 PT Individual Time Calculation (min): 55 min and 69 min  Short Term Goals: Week 2:  PT Short Term Goal 1 (Week 2): STG = LTG due to short ELOS.  Skilled Therapeutic Interventions/Progress Updates:  Treatment 1: Pt received in w/c & agreeable to tx. Pt reports 8/10 soreness in R ribs & pain meds requested from RN & administered during session, & rest breaks provided PRN during session. Pt propels w/c room>dayroom with BUE & supervision for cardiopulmonary endurance training. Pt transfers sit<>stand with close supervision & ambulates 100 ft x 2 with RW & close supervision with slight forward trunk lean on RW but improving BLE heel strike and pt able to demonstrate more R hip/knee flexion during swing phase with cuing. Pt engaged in dynavision while standing for 2 minutes + 2 minutes with alternating 1 UE support during first trial and no UE support during second trial with close supervision for balance with task focusing on standing tolerance & balance. Pt ambulates back to room with RW & supervision with ongoing cuing for gait pattern. At end of session pt left in w/c with alarm set & needs at hand. Pt very motivated by progress during session.  Treatment 2: Pt received in w/c & agreeable to tx. Pt reports 7/10 soreness in R ribs, meds requested & administered during session, & rest breaks provided during session. Pt transfers sit<>stand with supervision and ambulates room>gym with RW & supervision with forward lean and cuing for increased R hip/knee flexion during swing phase. Pt negotiates 8 steps laterally with R ascending rail and CGA. Pt completed Berg Balance Test & scored 23/56; educated pt on interpretation of score & current fall risk. Patient demonstrates increased fall risk as noted by score of 23/56 on Berg Balance  Scale.  (<36= high risk for falls, close to 100%; 37-45 significant >80%; 46-51 moderate >50%; 52-55 lower >25%). Pt utilized nu-step on level 5 x 10 minutes with all four extremities for global strengthening & endurance training. Pt ambulates back to room with RW & supervision. At end of session pt left in w/c with chair alarm donned & all needs at hand.   Therapy Documentation Precautions:  Precautions Precautions: Fall, Cervical Required Braces or Orthoses: Cervical Brace Cervical Brace: Hard collar, At all times Restrictions Weight Bearing Restrictions: Yes RLE Weight Bearing: Weight bearing as tolerated LLE Weight Bearing: Weight bearing as tolerated   Therapy/Group: Individual Therapy  [name] 12/28/2018, 3:03 PM
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
## 2018-12-28 NOTE — Progress Notes (Signed)
Occupational Therapy Session Note  Patient Details  Name: Ian Watson MRN: [id number] Date of Birth: [birth year]  Today's Date: 12/28/2018 OT Individual Time: 2197-5883 OT Individual Time Calculation (min): 44 min    Short Term Goals: Week 1:  OT Short Term Goal 1 (Week 1): STGs=LTGs due to ELOS  Skilled Therapeutic Interventions/Progress Updates:    Pt completed functional mobility from his room down to the dayroom with min guard assist and use of the RW.  Had pt work on standing balance and sit to stand transitions while engaged in use of the Wii.  He was able to complete several sit to stand transitions with one hand on the arm of the wheelchair and the other on the walker with min guard assist.  He maintained standing with use of the walker at times, but was also able to maintain static standing with close supervision without any UE support for periods of up to 5-6 mins before needing rest break.  Pt pleased with his ability to stand without UE support some.  Used wheelchair with pt transported back to the room at end of session.  Call button and phone in reach with safety belt in place and nursing present.    Therapy Documentation Precautions:  Precautions Precautions: Fall, Cervical Required Braces or Orthoses: Cervical Brace Cervical Brace: Hard collar, At all times Restrictions Weight Bearing Restrictions: Yes RLE Weight Bearing: Weight bearing as tolerated LLE Weight Bearing: Weight bearing as tolerated   Pain: Pain Assessment Pain Scale: Faces Pain Score: 1  Faces Pain Scale: Hurts a little bit Pain Type: Acute pain Pain Location: Rib cage Pain Orientation: Right Pain Descriptors / Indicators: Discomfort;Sore Pain Onset: With Activity Pain Intervention(s): Repositioned ADL: See Care Tool Section for some details of ADL  Therapy/Group: Individual Therapy  Fardeen Steinberger OTR/L 12/28/2018, 12:32 PM

## 2018-12-29 ENCOUNTER — Inpatient Hospital Stay (HOSPITAL_COMMUNITY): Payer: Medicare PPO

## 2018-12-29 DIAGNOSIS — I82401 Acute embolism and thrombosis of unspecified deep veins of right lower extremity: Secondary | ICD-10-CM

## 2018-12-29 DIAGNOSIS — R0781 Pleurodynia: Secondary | ICD-10-CM

## 2018-12-29 DIAGNOSIS — S069X0S Unspecified intracranial injury without loss of consciousness, sequela: Secondary | ICD-10-CM

## 2018-12-29 NOTE — Plan of Care (Signed)
  Problem: RH BLADDER ELIMINATION Goal: RH STG MANAGE BLADDER WITH ASSISTANCE Description: STG Manage Bladder With Assistance. Mod I Outcome: Progressing   Problem: RH SKIN INTEGRITY Goal: RH STG SKIN FREE OF INFECTION/BREAKDOWN Description: Free of breakdown and infection. Mod I Outcome: Progressing   Problem: RH SAFETY Goal: RH STG ADHERE TO SAFETY PRECAUTIONS W/ASSISTANCE/DEVICE Description: STG Adhere to Safety Precautions With Assistance/Device. Mod I Outcome: Progressing   Problem: RH PAIN MANAGEMENT Goal: RH STG PAIN MANAGED AT OR BELOW PT'S PAIN GOAL Description: Pain less than 4 Outcome: Progressing   

## 2018-12-29 NOTE — Progress Notes (Signed)
Occupational Therapy Session Note  Patient Details  Name: Joud Ingwersen MRN: [id number] Date of Birth: 07/26/1950  Today's Date: 12/29/2018 OT Individual Time: 1054-1130 OT Individual Time Calculation (min): 36 min    Short Term Goals: Week 1:  OT Short Term Goal 1 (Week 1): STGs=LTGs due to ELOS  Skilled Therapeutic Interventions/Progress Updates:    Pt received sitting up in w/c, declining ADLs. Pt with c/o soreness in the R ribs, 8/10. Willing to work through. Pt completed 250 ft of functional mobility with RW with (S). Cueing required for reducing BUE support on RW and for upright posture. Pt transferred to therapy mat. Pt completed 2x 8 sit <> stands with focus on no UE support to push up and eccentric control when lowering. Pt then completed standing balance activity, with BUE coordination component. Pt had no LOB and remained standing with no UE support for ~2 min each trial of 3 total. Pt completed another 250 ft of functional mobility back to the room and was left sitting up in his w/c. Chair alarm belt fastened, all needs within reach.   Therapy Documentation Precautions:  Precautions Precautions: Fall, Cervical Required Braces or Orthoses: Cervical Brace Cervical Brace: Hard collar, At all times Restrictions Weight Bearing Restrictions: Yes RLE Weight Bearing: Weight bearing as tolerated LLE Weight Bearing: Weight bearing as tolerated  Therapy/Group: Individual Therapy  Curtis Sites 12/29/2018, 7:21 AM
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
## 2018-12-30 ENCOUNTER — Inpatient Hospital Stay (HOSPITAL_COMMUNITY): Payer: Medicare PPO

## 2018-12-30 NOTE — Progress Notes (Signed)
Occupational Therapy Session Note  Patient Details  Name: Ian Watson MRN: [id number] Date of Birth: 1951/05/13  Today's Date: 12/30/2018 OT Individual Time: 2446-2863 OT Individual Time Calculation (min): 60 min   Session 2: OT Individual Time: 8177-1165 OT Individual Time Calculation (min): 56 min    Short Term Goals: Week 1:  OT Short Term Goal 1 (Week 1): STGs=LTGs due to ELOS  Skilled Therapeutic Interventions/Progress Updates:    Pt received sitting up in w/c with c/o soreness in ribs no other pain. Pt notified therapist that RW was broken and a new one was issued. Pt completed 250 ft of functional mobility with RW with (S). Pt completed standing level dynamic balance task with no RW support with no LOB. Included a BUE strengthening component within confrounts of cervical precautions. Pt completed 2x trials of stairs using single rail method, going up/down laterally, requiring CGA initially and then close (S). Discussed safety awareness at home and need for (S). Pt then completed dynamic standing balance with throwing/catching component x2 trials with no LOB. Pt demonstrated some memory deficits during session, asking same question multiple times. Pt requested to use NuStep to end session. He completed 100 ft of functional mobility to dayroom. He transferred onto Nustep and was assisted in set up. Pt completed 10 min at level 6 with intermittent cueing for increasing/decreasing intensity to maximize cardiovascular benefits. Pt returned to room and was left sitting up with all needs met.   Session 2: Pt received in w/c eager for therapy. Pt completed sit > stand and ambulatory transfer into bathroom with (S) using RW. Pt voided bowel/bladder with increased time. Pt completed peri hygiene seated with mod I. Pt transferred onto shower chair with mod I, demonstrating good safety awareness and transfer technique. Pt washed UB/LB from seated level with mod I. Pt sat EOB and donned pants with min  A, 2/2 long toenails getting caught in pants. Pt able to don socks with mod I. Cueing required to remind pt method of changing pads for cervical collar. Pt transitioned into supine and rolled R and L for collar to be doffed. Instructed pt step by step in changing pads, with pt completing about half the work. Handout left in pt's room for wife to follow up on changing these pads since poor carryover is anticipated. Pt returned to EOB and donned shirt with mod I. Pt transferred back to his w/c and was left sitting up with all needs met.   Therapy Documentation Precautions:  Precautions Precautions: Fall, Cervical Required Braces or Orthoses: Cervical Brace Cervical Brace: Hard collar, At all times Restrictions Weight Bearing Restrictions: Yes RLE Weight Bearing: Weight bearing as tolerated LLE Weight Bearing: Weight bearing as tolerated   Therapy/Group: Individual Therapy  Curtis Sites 12/30/2018, 8:36 AM
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
## 2018-12-30 NOTE — Progress Notes (Addendum)
Occupational Therapy Discharge Summary  Patient Details  Name: Ian Watson MRN: [id number] Date of Birth: Mar 19, 1951  Today's Date: 01/01/2019     Patient has met 7 of 10 long term goals due to improved activity tolerance, improved balance, postural control, ability to compensate for deficits, functional use of  RIGHT upper extremity, improved attention, improved awareness and improved coordination.  Patient to discharge at overall Modified Independent /set up assist level.  Patient's care partner is independent to provide the necessary physical and cognitive assistance at discharge.    Reasons goals not met: Pt remains limited by R flank pain 2/2 rib fractures.  He needs supervision for dynamic standing balance, toilet transfers, and donning cervical collar when changing out pads after showering  Recommendation:  Patient will benefit from ongoing skilled OT services in home health setting to continue to advance functional skills in the area of BADL. Pt continues to need assistance with donning cervical collar as well as removing and changing out the pads after a shower.  He still needs supervision for functional mobility with use of the walker as well as cueing for safe techniques such as not standing to dry off or to not try and stand on a towel but instead remove it from the floor before attempting transfer out of the shower.  Feel he will benefit from continued HHOT to further progress ADL status to modified independent level.    Equipment: No equipment provided  Reasons for discharge: treatment goals met and discharge from hospital  Patient/family agrees with progress made and goals achieved: Yes  OT Discharge Precautions/Restrictions  Precautions Precautions: Fall;Cervical Required Braces or Orthoses: Cervical Brace Cervical Brace: Hard collar;At all times Restrictions Weight Bearing Restrictions: Yes RLE Weight Bearing: Weight bearing as tolerated LLE Weight Bearing: Weight  bearing as tolerated Pain Pain Assessment Pain Scale: Faces Faces Pain Scale: Hurts a little bit Pain Type: Acute pain Pain Location: Rib cage Pain Orientation: Right Pain Descriptors / Indicators: Discomfort Pain Onset: With Activity Pain Intervention(s): Repositioned Vision Baseline Vision/History: Cataracts Patient Visual Report: Blurring of vision Vision Assessment?: No apparent visual deficits Perception  Perception: Within Functional Limits Praxis Praxis: Intact Cognition Overall Cognitive Status: Within Functional Limits for tasks assessed Arousal/Alertness: Awake/alert Orientation Level: Oriented X4 Attention: Selective Selective Attention: Appears intact Memory: Impaired Memory Impairment: Storage deficit;Retrieval deficit Awareness: Appears intact Problem Solving: Impaired Problem Solving Impairment: Functional complex Executive Function: Organizing;Self Monitoring;Self Correcting Organizing: Impaired Organizing Impairment: Functional complex Self Monitoring: Impaired Self Monitoring Impairment: Functional complex Self Correcting: Impaired Self Correcting Impairment: Functional complex Safety/Judgment: Appears intact Sensation Sensation Light Touch: Appears Intact Proprioception: Appears Intact Coordination Gross Motor Movements are Fluid and Coordinated: Yes Fine Motor Movements are Fluid and Coordinated: Yes Coordination and Movement Description: some generalized weakness overall but overall WFL Motor  Motor Motor: Abnormal postural alignment and control Motor - Discharge Observations: kyphotic posture, generalized weakness Mobility  Bed Mobility Bed Mobility: Rolling Right;Supine to Sit Rolling Right: Independent with assistive device Supine to Sit: Independent with assistive device Transfers Sit to Stand: Independent with assistive device Stand to Sit: Independent with assistive device  Trunk/Postural Assessment  Cervical Assessment Cervical  Assessment: Exceptions to WFL(forward head) Thoracic Assessment Thoracic Assessment: Exceptions to WFL(kyphotic posture, rounded shoulders) Lumbar Assessment Lumbar Assessment: Within Functional Limits Postural Control Postural Control: Deficits on evaluation Righting Reactions: delayed Protective Responses: delayed  Balance Balance Balance Assessed: Yes Static Sitting Balance Static Sitting - Balance Support: Feet supported Static Sitting - Level of Assistance: 7: Independent Dynamic  Sitting Balance Dynamic Sitting - Balance Support: Feet supported Dynamic Sitting - Level of Assistance: 6: Modified independent (Device/Increase time) Static Standing Balance Static Standing - Balance Support: During functional activity Static Standing - Level of Assistance: 6: Modified independent (Device/Increase time) Dynamic Standing Balance Dynamic Standing - Balance Support: During functional activity;No upper extremity supported Dynamic Standing - Level of Assistance: 6: Modified independent (Device/Increase time) Extremity/Trunk Assessment RUE Assessment RUE Assessment: Exceptions to [facility] General Strength Comments: 4/5 grossly LUE Assessment LUE Assessment: Exceptions to [facility] General Strength Comments: 4/5 grossly   Ian Watson 12/31/2018, 12:28 PM

## 2018-12-30 NOTE — Plan of Care (Signed)
  Problem: RH BLADDER ELIMINATION Goal: RH STG MANAGE BLADDER WITH ASSISTANCE Description: STG Manage Bladder With Assistance. Mod I Outcome: Progressing   Problem: RH SKIN INTEGRITY Goal: RH STG SKIN FREE OF INFECTION/BREAKDOWN Description: Free of breakdown and infection. Mod I Outcome: Progressing   Problem: RH SAFETY Goal: RH STG ADHERE TO SAFETY PRECAUTIONS W/ASSISTANCE/DEVICE Description: STG Adhere to Safety Precautions With Assistance/Device. Mod I Outcome: Progressing   Problem: RH PAIN MANAGEMENT Goal: RH STG PAIN MANAGED AT OR BELOW PT'S PAIN GOAL Description: Pain less than 4 Outcome: Progressing

## 2018-12-31 ENCOUNTER — Inpatient Hospital Stay (HOSPITAL_COMMUNITY): Payer: Medicare PPO | Admitting: Physical Therapy

## 2018-12-31 ENCOUNTER — Inpatient Hospital Stay (HOSPITAL_COMMUNITY): Payer: Medicare PPO

## 2018-12-31 ENCOUNTER — Encounter (HOSPITAL_COMMUNITY): Payer: Medicare PPO

## 2018-12-31 ENCOUNTER — Inpatient Hospital Stay (HOSPITAL_COMMUNITY): Payer: Medicare PPO | Admitting: Occupational Therapy

## 2018-12-31 DIAGNOSIS — Z86718 Personal history of other venous thrombosis and embolism: Secondary | ICD-10-CM

## 2018-12-31 DIAGNOSIS — S069X1S Unspecified intracranial injury with loss of consciousness of 30 minutes or less, sequela: Secondary | ICD-10-CM

## 2018-12-31 DIAGNOSIS — R2689 Other abnormalities of gait and mobility: Secondary | ICD-10-CM

## 2018-12-31 LAB — CBC
HCT: 34.2 % — ABNORMAL LOW (ref 39.0–52.0)
Hemoglobin: 10.5 g/dL — ABNORMAL LOW (ref 13.0–17.0)
MCH: 29.2 pg (ref 26.0–34.0)
MCHC: 30.7 g/dL (ref 30.0–36.0)
MCV: 95.3 fL (ref 80.0–100.0)
Platelets: 420 10*3/uL — ABNORMAL HIGH (ref 150–400)
RBC: 3.59 MIL/uL — ABNORMAL LOW (ref 4.22–5.81)
RDW: 14.1 % (ref 11.5–15.5)
WBC: 6.5 10*3/uL (ref 4.0–10.5)
nRBC: 0 % (ref 0.0–0.2)

## 2018-12-31 LAB — BASIC METABOLIC PANEL
Anion gap: 9 (ref 5–15)
BUN: 10 mg/dL (ref 8–23)
CO2: 23 mmol/L (ref 22–32)
Calcium: 9 mg/dL (ref 8.9–10.3)
Chloride: 106 mmol/L (ref 98–111)
Creatinine, Ser: 0.77 mg/dL (ref 0.61–1.24)
GFR calc Af Amer: >60 mL/min (ref >60)
GFR calc non Af Amer: >60 mL/min (ref >60)
Glucose, Bld: 149 mg/dL — ABNORMAL HIGH (ref 70–99)
Potassium: 4 mmol/L (ref 3.5–5.1)
Sodium: 138 mmol/L (ref 135–145)

## 2018-12-31 MED ORDER — ACETAMINOPHEN 325 MG PO TABS: 650.0000 mg | ORAL_TABLET | ORAL | Status: AC | PRN

## 2018-12-31 MED ORDER — METHOCARBAMOL 500 MG PO TABS: 1000.0000 mg | ORAL_TABLET | Freq: Three times a day (TID) | ORAL | 0 refills | Status: AC

## 2018-12-31 MED ORDER — DOCUSATE SODIUM 100 MG PO CAPS: 100.0000 mg | ORAL_CAPSULE | Freq: Two times a day (BID) | ORAL | 0 refills | Status: AC

## 2018-12-31 MED ORDER — OXYCODONE HCL 5 MG PO TABS: 5.0000 mg | ORAL_TABLET | ORAL | 0 refills | Status: AC | PRN

## 2018-12-31 MED ORDER — GABAPENTIN 600 MG PO TABS: 300.0000 mg | ORAL_TABLET | Freq: Three times a day (TID) | ORAL | 1 refills | Status: AC

## 2018-12-31 MED ORDER — POLYETHYLENE GLYCOL 3350 17 G PO PACK: 17.0000 g | PACK | Freq: Two times a day (BID) | ORAL | 0 refills | Status: AC
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
## 2018-12-31 NOTE — Progress Notes (Signed)
Physical Therapy Session Note  Patient Details  Name: Ian Watson MRN: [id number] Date of Birth: 09-17-1950  Today's Date: 12/31/2018 PT Individual Time: 1124-1204 and 1304-1400 and 1431-1501 PT Individual Time Calculation (min): 40 min and 26 min and 30 min  Short Term Goals: Week 2:  PT Short Term Goal 1 (Week 2): STG = LTG due to short ELOS.  Skilled Therapeutic Interventions/Progress Updates:  Treatment 1: Pt received in w/c & agreeable to tx, reporting 7/10 soreness in ribs & R side of pelvis but states he's premedicated & rest breaks provided PRN. Pt transfers sit<>stand with supervision and ambulates short distance in room to bed, completing sit<>supine with supervision with bed flat, no rails, with bed height to simulate couch height as pt reports he plans to sleep on couch at home. Pt ambulates room>gym with RW & supervision with slight forward lean on RW, decreased R hip/knee flexion during swing phase with decreased gait speed. Pt negotiates 4 steps (6") laterally with R rail and supervision and 12 steps (6" + 3") with B rails and supervision. Pt negotiates ramp and uneven surface with RW & supervision and completes car transfer at sedan simulated height with supervision. Pt retrieves object from floor standing with BUE support on RW & using reacher to grab object. Pt propels w/c back towards room with BUE & set up assist. Reviewed need to wear cervical collar at all times and need to maintain neutral head alignment at all times with pt requiring cuing during session. At end of session pt left in w/c with chair alarm donned & all needs at hand. Discussed home safety (potential of increased difficulty when transferring in/out of chair without armrests, need to ensure dog does not get underfoot & trip pt) with pt verbalizing understanding of all information.   Treatment 2: Pt received in w/c & agreeable to tx. Pt reports soreness "is about the same" but declines requesting for pain meds and  rest breaks provided PRN. Pt transfers sit<>stand with supervision and ambulates room<>dayroom with RW & supervision in same manner as noted above. Provided pt with HEP handout (standing marches, mini squats, heel raises, and standing knee flexion) with pt return demonstrating all exercises with instructional cuing for technique. Pt engaged in zoom ball while standing with task focusing on standing tolerance and BLE strengthening, as well as cardiopulmonary endurance training; pt is able to stand for <2 minutes x 2 trials. Pt performs seated LAQ 2 sets x 10 reps. At end of session pt left in w/c with chair alarm donned & all needs at hand. Pt reports 7/10 pain at end of session & requests pain meds - RN made aware.   Treatment 3: Pt received in w/c & agreeable to tx. Pt with ongoing c/o soreness in R pelvis but states he's premedicated, rest breaks provided PRN. Pt transfers sit<>stand with supervision & ambulates room<>dayroom with RW & supervision with cuing for upright posture as pt with more forward lean during this session. Pt utilized nu-step (pt requested to earlier) with all four extremities on level 5 x 10 minutes with task focusing on global strengthening & endurance training. Back in room pt performs 20x sit<>stand with UE support and supervision with less time required for activity on this date, with task focusing on BLE strengthening. At end of session pt left in w/c with chair alarm donned & all needs in reach.   Therapy Documentation Precautions:  Precautions Precautions: Fall, Cervical Required Braces or Orthoses: Cervical Brace Cervical Brace: Hard  collar, At all times Restrictions Weight Bearing Restrictions: Yes RLE Weight Bearing: Weight bearing as tolerated LLE Weight Bearing: Weight bearing as tolerated     Therapy/Group: Individual Therapy  Sandi MariscalVictoria M Parth Mccormac 12/31/2018, 3:01 PM

## 2018-12-31 NOTE — Progress Notes (Signed)
Left lower extremity venous duplex has been completed. Preliminary results can be found in CV Proc through chart review.  Results were given to the patient's nurse, Jeanna.   12/31/18 9:31 AM Carlos Levering RVT
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
## 2018-12-31 NOTE — Progress Notes (Signed)
Occupational Therapy Session Note  Patient Details  Name: Ian Watson MRN: [id number] Date of Birth: 08/20/1950  Today's Date: 12/31/2018 OT Individual Time: 1001-1059 OT Individual Time Calculation (min): 58 min    Short Term Goals: Week 1:  OT Short Term Goal 1 (Week 1): STGs=LTGs due to ELOS  Skilled Therapeutic Interventions/Progress Updates:    Session Note:  Pt completed shower and dressing during session.  He was able to wash all parts sit to stand in the shower with supervision and with use of the grab bar.  He completed all transfers during session with use of the RW for support.  He was able to complete dressing sit to stand at the sink with modified independent level for LB and min assist for UB.  He needed min assist and max instructional cueing for changing out the wet cervical pads in his cervical collar.  He was able to donn his gripper socks with modified independence and without use of an assistive device.  He finished session with completion of brushing his teeth and shaving with modified independence.  Pt left in the wheelchair with call button and phone in reach with safety belt in place.    Therapy Documentation Precautions:  Precautions Precautions: Fall, Cervical Required Braces or Orthoses: Cervical Brace Cervical Brace: Hard collar, At all times Restrictions Weight Bearing Restrictions: Yes RLE Weight Bearing: Weight bearing as tolerated LLE Weight Bearing: Weight bearing as tolerated  Pain: Pain Assessment Pain Scale: 0-10 Pain Score: 7  Pain Type: Acute pain Pain Location: Rib cage Pain Orientation: Right Pain Descriptors / Indicators: Aching;Sore Pain Frequency: Intermittent Pain Onset: With Activity Patients Stated Pain Goal: 4 Pain Intervention(s): Medication (See eMAR) ADL: See Care Tool Section for some details of ADL  Therapy/Group: Individual Therapy  Abas Leicht OTR/L 12/31/2018, 3:02 PM

## 2018-12-31 NOTE — Progress Notes (Signed)
Physical Therapy Discharge Summary  Patient Details  Name: Ian Watson MRN: [id number] Date of Birth: 06-29-1950  Today's Date: 01/01/2019   Patient has met 9 of 9 long term goals due to improved activity tolerance, improved balance, improved postural control, increased strength, decreased pain, ability to compensate for deficits and improved awareness.  Patient to discharge at an ambulatory level supervision with RW.   Patient's care partner is independent to provide the necessary physical assistance at discharge.  Reasons goals not met: n/a  Recommendation:  Patient will benefit from ongoing skilled PT services in home health setting to continue to advance safe functional mobility, address ongoing impairments in gait with LRAD, stair negotiation, endurance, strengthening, balance, and minimize fall risk.  Equipment: RW  Reasons for discharge: treatment goals met and discharge from hospital  Patient/family agrees with progress made and goals achieved: Yes  PT Discharge Precautions/Restrictions Precautions Precautions: Fall;Cervical Required Braces or Orthoses: Cervical Brace Cervical Brace: Hard collar;At all times Restrictions Weight Bearing Restrictions: Yes RLE Weight Bearing: Weight bearing as tolerated LLE Weight Bearing: Weight bearing as tolerated   Vision/Perception  Pt with hx of cataracts & blurring of vision. No current changes in baseline vision.  Perception Perception: Within Functional Limits   Cognition Overall Cognitive Status: Within Functional Limits for tasks assessed Arousal/Alertness: Awake/alert Orientation Level: Oriented X4 Awareness: Appears intact Self Monitoring: Impaired(requires cuing to maintain cervical precautions as pt will attempt to shake head yes/no) Self Correcting: Impaired(requires cuing to maintain cervical precautions as pt will attempt to shake head yes/no) Safety/Judgment: Appears intact [employer] Scales of Cognitive  Functioning: Purposeful/appropriate  Sensation Sensation Light Touch: Appears Intact Proprioception: Appears Intact Coordination Gross Motor Movements are Fluid and Coordinated: Yes Fine Motor Movements are Fluid and Coordinated: Yes Coordination and Movement Description: some generalized weakness overall but overall WFL  Motor  Motor Motor: Abnormal postural alignment and control Motor - Skilled Clinical Observations: generalized deconditioning & weakness Motor - Discharge Observations: kyphotic posture, generalized weakness   Mobility Bed Mobility Bed Mobility: Supine to Sit;Sit to Supine(bed flat, no rails, extra time) Supine to Sit: Supervision/Verbal cueing Sit to Supine: Supervision/Verbal cueing Transfers Transfers: Sit to Stand;Stand to Sit Sit to Stand: Supervision/Verbal cueing Stand to Sit: Supervision/Verbal cueing Transfer (Assistive device): Rolling walker  Locomotion  Gait Ambulation: Yes Gait Assistance: Supervision/Verbal cueing Gait Distance (Feet): (>150 ft) Assistive device: Rolling walker Gait Assistance Details: Verbal cues for sequencing;Verbal cues for technique Gait Gait: Yes Gait Pattern: Impaired Gait Pattern: Decreased stride length;Decreased step length - left;Decreased step length - right;Decreased dorsiflexion - right(decreased heel strike RLE, decreased hip/knee flexion RLE during swing phase) Gait velocity: Decreased Stairs / Additional Locomotion Stairs: Yes Stairs Assistance: Supervision/Verbal cueing Number of Stairs: 12 Height of Stairs: (6" + 3") Four steps (6") laterally with R ascending rail to simulate home entrance Ramp: Supervision/Verbal cueing(ambulatory with RW) Product manager Mobility: No Wheelchair Assistance: Set up Lexicographer: Both upper extremities Wheelchair Parts Management: Needs assistance Distance: 150 ft   Trunk/Postural Assessment  Cervical Assessment Cervical  Assessment: Exceptions to WFL(forward head, cervical collar) Thoracic Assessment Thoracic Assessment: Exceptions to WFL(kyphotic posture, rounded shoulders) Lumbar Assessment Lumbar Assessment: Within Functional Limits Postural Control Postural Control: Deficits on evaluation Righting Reactions: delayed Protective Responses: delayed   Balance Balance Balance Assessed: Yes Dynamic Standing Balance Dynamic Standing - Balance Support: During functional activity;No upper extremity supported Dynamic Standing - Level of Assistance: 5: Stand by assistance Berg Balance Test = 23/56 on 12/28/2018  Extremity  Assessment  RUE Assessment RUE Assessment: Within Functional Limits LUE Assessment LUE Assessment: Within Functional Limits BLE AROM WFL, strength WNL - not formally assessed, pt continues to be limited by pain (R side of pelvis, 2/2 pelvic fractures)   [name] 01/01/2019, 12:17 PM

## 2019-01-01 ENCOUNTER — Encounter (HOSPITAL_COMMUNITY): Payer: Medicare PPO | Admitting: Occupational Therapy

## 2019-01-01 ENCOUNTER — Ambulatory Visit (HOSPITAL_COMMUNITY): Payer: Medicare PPO | Admitting: Physical Therapy

## 2019-01-01 DIAGNOSIS — I825Z2 Chronic embolism and thrombosis of unspecified deep veins of left distal lower extremity: Secondary | ICD-10-CM

## 2019-01-01 MED ORDER — ASPIRIN EC 325 MG PO TBEC: 325.0000 mg | DELAYED_RELEASE_TABLET | Freq: Every day | ORAL | 3 refills | Status: AC
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
## 2019-01-01 NOTE — Progress Notes (Signed)
Occupational Therapy Session Note  Patient Details  Name: Ian Watson MRN: [id number] Date of Birth: [birth year]  Today's Date: 01/01/2019 OT Individual Time: 1030-1100 OT Individual Time Calculation (min): 30 min    Short Term Goals: Week 1:  OT Short Term Goal 1 (Week 1): STGs=LTGs due to ELOS     Skilled Therapeutic Interventions/Progress Updates:    Family education with pt and his wife.  Reviewed pt's level of I with self care and that he needs extra time to complete LB dressing and needs S for bathroom transfers, showering, and dynamic balance. Demonstrated to spouse how to change out C collar pads after shower.  She feels comfortable with providing him the supervision and cuing he needs.   Therapy Documentation Precautions:  Precautions Precautions: Fall, Cervical Required Braces or Orthoses: Cervical Brace Cervical Brace: Hard collar, At all times Restrictions Weight Bearing Restrictions: Yes RLE Weight Bearing: Weight bearing as tolerated LLE Weight Bearing: Weight bearing as tolerated    Vital Signs: Therapy Vitals Temp: 98.6 F (37 C) Temp Source: Oral Pulse Rate: 79 Resp: 18 BP: 128/72 Patient Position (if appropriate): Lying Oxygen Therapy SpO2: 97 % Pain:   chronic rib pain from fx's  ADL: ADL Eating: Modified independent Where Assessed-Eating: Chair Grooming: Modified independent Where Assessed-Grooming: Sitting at sink Upper Body Bathing: Supervision/safety Where Assessed-Upper Body Bathing: Shower Lower Body Bathing: Supervision/safety Where Assessed-Lower Body Bathing: Shower Upper Body Dressing: Minimal assistance Where Assessed-Upper Body Dressing: Sitting at sink, Wheelchair Lower Body Dressing: Modified independent Where Assessed-Lower Body Dressing: Sitting at sink, Standing at sink, Wheelchair Toileting: Supervision/safety Where Assessed-Toileting: Glass blower/designer: Close supervision Armed forces technical officer Method: Air traffic controller: Raised toilet seat, Energy manager: Not assessed Social research officer, government: Close supervision Social research officer, government Method: Heritage manager: Radio broadcast assistant   Therapy/Group: Individual Therapy  Ian Watson 01/01/2019, 8:28 AM

## 2019-01-01 NOTE — Progress Notes (Signed)
Discharge instructions provided to patient and spouse by Marlowe Shores, PA-C with verbal understanding. Patient being discharged to home with spouse with patient belongings. This RN provided answers concerning dietary and outpatient dopplers on the left lower extremity.

## 2019-01-01 NOTE — Progress Notes (Signed)
Social Work Discharge Note   The overall goal for the admission was met for:   Discharge location: Yes - home with wife who can provide 24/7 support  Length of Stay: Yes - 11 days  Discharge activity level: Yes - supervision to modified independent  Home/community participation: Yes  Services provided included: MD, RD, PT, OT, RN, Pharmacy and [place]: Medicare  Follow-up services arranged: Home Health: PT, OT via [facility], DME: rolling walker via [place] and Patient/Family has no preference for HH/DME agencies  Comments (or additional information):     Contact info:  Pt @ [phone]  Patient/Family verbalized understanding of follow-up arrangements: Yes  Individual responsible for coordination of the follow-up plan: pt  Confirmed correct DME delivered: Kahne Helfand 01/01/2019    Inayah Woodin

## 2019-01-01 NOTE — Progress Notes (Signed)
Physical Therapy Session Note  Patient Details  Name: Ian Watson MRN: [id number] Date of Birth: 1950/09/22  Today's Date: 01/01/2019 PT Individual Time: 1006-1031 PT Individual Time Calculation (min): 25 min   Short Term Goals: Week 2:  PT Short Term Goal 1 (Week 2): STG = LTG due to short ELOS.  Skilled Therapeutic Interventions/Progress Updates:  Pt received in w/c with wife present for family training. Pt denies pain but reports unrated soreness in R side ribs with rest breaks provided PRN. Educated pt & wife on home modifications to reduce fall risk (remove/secure throw rugs, need to ensure dog does not get underfoot), f/u HHPT, CLOF at supervision level with RW, need to wear cervical brace at all times, and neck precautions. Pt transfers sit<>stand with supervision and RW & ambulates room<>gym with RW & supervision with forward trunk lean on RW, decreased R hip/knee flexion during swing phase. Pt negotiates 4 steps laterally with R rail to simulate home entrance, with supervision overall with therapist providing supervision then wife providing supervision with therapist educating her on her positioning in relation to pt but poor return demo. Discussed car transfer & need for pt to sit then transfer BLE in/out of car vs stepping in. At end of session pt left in w/c with wife present to supervise & call bell in reach.   Therapy Documentation Precautions:  Precautions Precautions: Fall, Cervical Required Braces or Orthoses: Cervical Brace Cervical Brace: Hard collar, At all times Restrictions Weight Bearing Restrictions: Yes RLE Weight Bearing: Weight bearing as tolerated LLE Weight Bearing: Weight bearing as tolerated    Therapy/Group: Individual Therapy  [name] 01/01/2019, 10:33 AM

## 2019-01-01 NOTE — Discharge Instructions (Signed)
Inpatient Rehab Discharge Instructions  [facility] Discharge date and time: No discharge date for patient encounter.   Activities/Precautions/ Functional Status: Activity: activity as tolerated Diet: soft Wound Care: keep wound clean and dry Functional status:  ___ No restrictions     ___ Walk up steps independently ___ 24/7 supervision/assistance   ___ Walk up steps with assistance ___ Intermittent supervision/assistance  ___ Bathe/dress independently ___ Walk with walker     _x__ Bathe/dress with assistance ___ Walk Independently    ___ Shower independently ___ Walk with assistance    ___ Shower with assistance ___ No alcohol     ___ Return to work/school ________    COMMUNITY REFERRALS UPON DISCHARGE:    Home Health:   PT     OT                      Agency:  [facility]    Phone: [phone]   Medical Equipment/Items Ordered:  Gilford Rile                                                      Agency/Supplier:  [facility] @ [phone]     Special Instructions: Cervical collar at all times  No smoking driving or alcohol  Follow-up outpatient vascular study for monitoring of left gastrocnemius vein DVT   My questions have been answered and I understand these instructions. I will adhere to these goals and the provided educational materials after my discharge from the hospital.  Patient/Caregiver Signature _______________________________ Date __________  Clinician Signature _______________________________________ Date __________  Please bring this form and your medication list with you to all your follow-up doctor's appointments.

## 2019-01-03 ENCOUNTER — Telehealth: Payer: Self-pay

## 2019-01-03 NOTE — Telephone Encounter (Signed)
Attempted to do Transitional Care Questions, no answer, left voicemail to return call  Questions for our staff to ask patients on Transitional care 48 hour phone call:   1. Are you/is patient experiencing any problems since coming home? Are there any questions regarding any aspect of care?   2. Are there any questions regarding medications administration/dosing? Are meds being taken as prescribed? Patient should review meds with caller to confirm   3. Have there been any falls?   4. Has Home Health been to the house and/or have they contacted you? If not, have you tried to contact them? Can we help you contact them?   5. Are bowels and bladder emptying properly? Are there any unexpected incontinence issues? If applicable, is patient following bowel/bladder programs?   6. Any fevers, problems with breathing, unexpected pain?   7. Are there any skin problems or new areas of breakdown?   8. Has the patient/family member arranged specialty MD follow up (ie cardiology/neurology/renal/surgical/etc)? Can we help arrange?   9. Does the patient need any other services or support that we can help arrange?   10. Are caregivers following through as expected in assisting the patient?   11. Has the patient quit smoking, drinking alcohol, or using drugs as recommended?

## 2019-01-14 ENCOUNTER — Encounter: Payer: Medicare PPO | Attending: Registered Nurse | Admitting: Registered Nurse

## 2019-01-14 ENCOUNTER — Other Ambulatory Visit: Payer: Self-pay

## 2019-01-14 VITALS — BP 149/82 | HR 97 | Temp 97.5°F | Resp 18 | Ht 74.0 in | Wt 250.6 lb

## 2019-01-14 DIAGNOSIS — I609 Nontraumatic subarachnoid hemorrhage, unspecified: Secondary | ICD-10-CM | POA: Diagnosis not present

## 2019-01-14 DIAGNOSIS — S069X1A Unspecified intracranial injury with loss of consciousness of 30 minutes or less, initial encounter: Secondary | ICD-10-CM
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
## 2019-01-16 ENCOUNTER — Other Ambulatory Visit (HOSPITAL_COMMUNITY): Payer: Self-pay | Admitting: Physical Medicine & Rehabilitation

## 2019-01-16 ENCOUNTER — Ambulatory Visit (HOSPITAL_BASED_OUTPATIENT_CLINIC_OR_DEPARTMENT_OTHER)
Admission: RE | Admit: 2019-01-16 | Discharge: 2019-01-16 | Disposition: A | Discharge status: Home / Self Care | Payer: Medicare PPO | Source: Ambulatory Visit | Attending: Physical Medicine & Rehabilitation | Admitting: Physical Medicine & Rehabilitation

## 2019-01-16 ENCOUNTER — Other Ambulatory Visit: Payer: Self-pay

## 2019-01-16 DIAGNOSIS — I824Z2 Acute embolism and thrombosis of unspecified deep veins of left distal lower extremity: Secondary | ICD-10-CM | POA: Diagnosis present

## 2019-01-16 NOTE — Progress Notes (Signed)
Left lower extremity venous doppler performed    01/16/2019 Cardell Peach RDCS,RVT

## 2019-01-17 ENCOUNTER — Encounter: Payer: Self-pay | Admitting: Registered Nurse

## 2019-01-22 ENCOUNTER — Telehealth (HOSPITAL_COMMUNITY): Payer: Self-pay | Admitting: Physical Medicine & Rehabilitation

## 2019-01-22 NOTE — Telephone Encounter (Signed)
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 Continue with [facility] and we'll recheck dopplers in 2 months. He has an appt to see Dr Posey Pronto on 9/15

## 2019-01-23 DIAGNOSIS — S3210XD Unspecified fracture of sacrum, subsequent encounter for fracture with routine healing: Secondary | ICD-10-CM

## 2019-01-23 DIAGNOSIS — S32502D Unspecified fracture of left pubis, subsequent encounter for fracture with routine healing: Secondary | ICD-10-CM

## 2019-01-23 DIAGNOSIS — S12600D Unspecified displaced fracture of seventh cervical vertebra, subsequent encounter for fracture with routine healing: Secondary | ICD-10-CM | POA: Diagnosis not present

## 2019-01-23 DIAGNOSIS — S32401D Unspecified fracture of right acetabulum, subsequent encounter for fracture with routine healing: Secondary | ICD-10-CM

## 2019-01-23 DIAGNOSIS — S22019D Unspecified fracture of first thoracic vertebra, subsequent encounter for fracture with routine healing: Secondary | ICD-10-CM | POA: Diagnosis not present

## 2019-01-23 DIAGNOSIS — Z86718 Personal history of other venous thrombosis and embolism: Secondary | ICD-10-CM

## 2019-01-23 DIAGNOSIS — S2241XD Multiple fractures of ribs, right side, subsequent encounter for fracture with routine healing: Secondary | ICD-10-CM

## 2019-01-23 DIAGNOSIS — S32501D Unspecified fracture of right pubis, subsequent encounter for fracture with routine healing: Secondary | ICD-10-CM

## 2019-02-26 ENCOUNTER — Encounter: Payer: Medicare PPO | Attending: Registered Nurse | Admitting: Physical Medicine & Rehabilitation

## 2019-02-26 DIAGNOSIS — S069X1A Unspecified intracranial injury with loss of consciousness of 30 minutes or less, initial encounter: Secondary | ICD-10-CM | POA: Insufficient documentation

## 2019-02-26 DIAGNOSIS — I609 Nontraumatic subarachnoid hemorrhage, unspecified: Secondary | ICD-10-CM | POA: Insufficient documentation

## 2020-01-05 ENCOUNTER — Encounter (HOSPITAL_BASED_OUTPATIENT_CLINIC_OR_DEPARTMENT_OTHER): Payer: Self-pay | Admitting: Emergency Medicine

## 2020-01-05 ENCOUNTER — Other Ambulatory Visit: Payer: Self-pay

## 2020-01-05 ENCOUNTER — Emergency Department (HOSPITAL_BASED_OUTPATIENT_CLINIC_OR_DEPARTMENT_OTHER)
Admission: EM | Admit: 2020-01-05 | Discharge: 2020-01-05 | Disposition: A | Discharge status: Home / Self Care | Payer: Medicare Other | Attending: Emergency Medicine | Admitting: Emergency Medicine

## 2020-01-05 ENCOUNTER — Emergency Department (HOSPITAL_BASED_OUTPATIENT_CLINIC_OR_DEPARTMENT_OTHER): Payer: Medicare Other

## 2020-01-05 DIAGNOSIS — M7918 Myalgia, other site: Secondary | ICD-10-CM | POA: Diagnosis not present

## 2020-01-05 DIAGNOSIS — M25561 Pain in right knee: Secondary | ICD-10-CM

## 2020-01-05 MED ORDER — INDOMETHACIN 25 MG PO CAPS: 25.0000 mg | ORAL_CAPSULE | Freq: Three times a day (TID) | ORAL | 0 refills | Status: AC | PRN
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
## 2020-01-05 NOTE — Discharge Instructions (Addendum)
Take indomethacin as prescribed and follow-up with your primary care provider.

## 2020-01-05 NOTE — ED Triage Notes (Signed)
Patient states that he has had an older injury to his right knee, he has had fluid drawn off in the past. Since Friday the pain has been increased and know he is having trouble bending it  - pt walks to triage with a limp
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
## 2021-06-24 IMAGING — CR CHEST  1 VIEW
1 series · 1 of 1 positions shown · non-contrast
Comparison: 12/12/2018

CLINICAL DATA: Follow-up right pneumothorax

EXAM:
CHEST  1 VIEW

[chest ap]
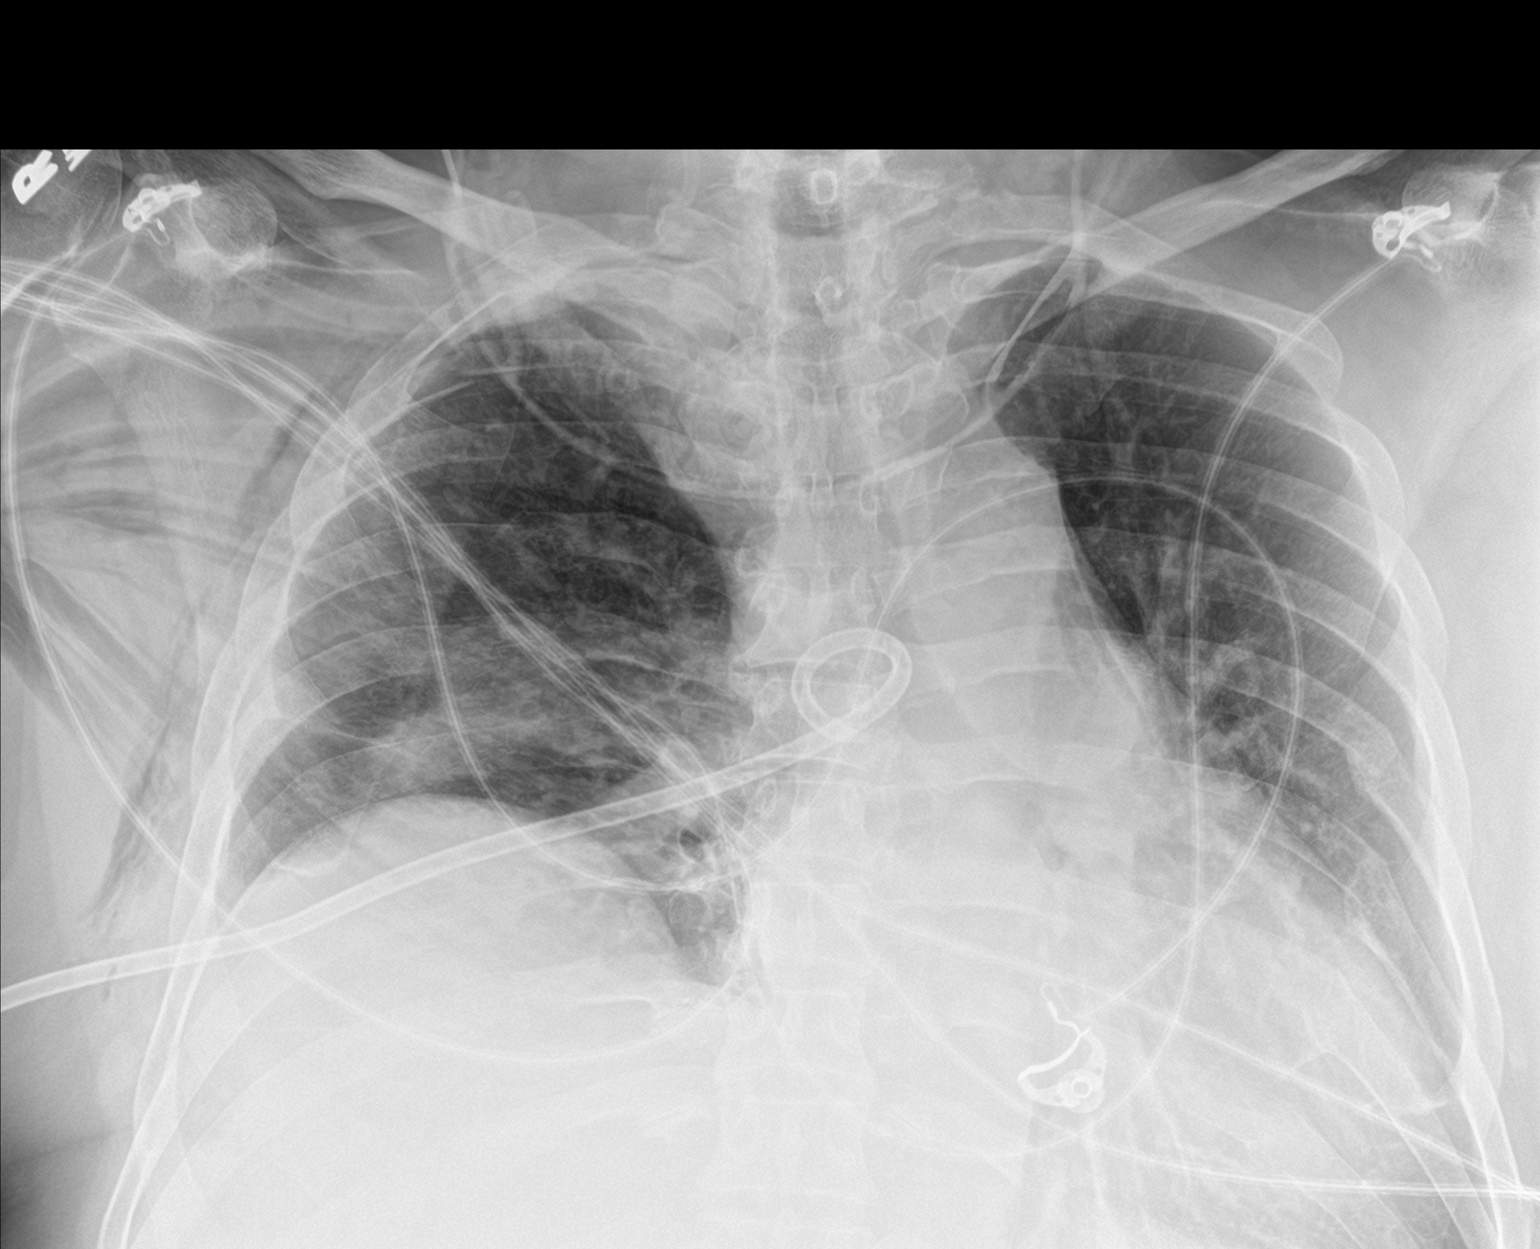

[1 of 1 positions shown; findings below may reference images not displayed]

FINDINGS: Cardiac shadow is stable. Right-sided pigtail catheter is again
noted and stable. The right pneumothorax is not well appreciated on
today's exam. Multiple rib fractures on the right are seen. Old rib
fractures on the left are noted as well. Patchy density is noted in
the right lung base consistent with focal contusion. No other focal
abnormality is seen.
IMPRESSION: Multiple right rib fractures. Right-sided pneumothorax has resolved
in the interval.

Persistent right basilar contusion.

## 2021-06-25 IMAGING — CR PORTABLE CHEST - 1 VIEW
1 series · 1 of 1 positions shown · non-contrast
Comparison: 12/13/2018

CLINICAL DATA: Pneumothorax

EXAM:
PORTABLE CHEST 1 VIEW

[AP]
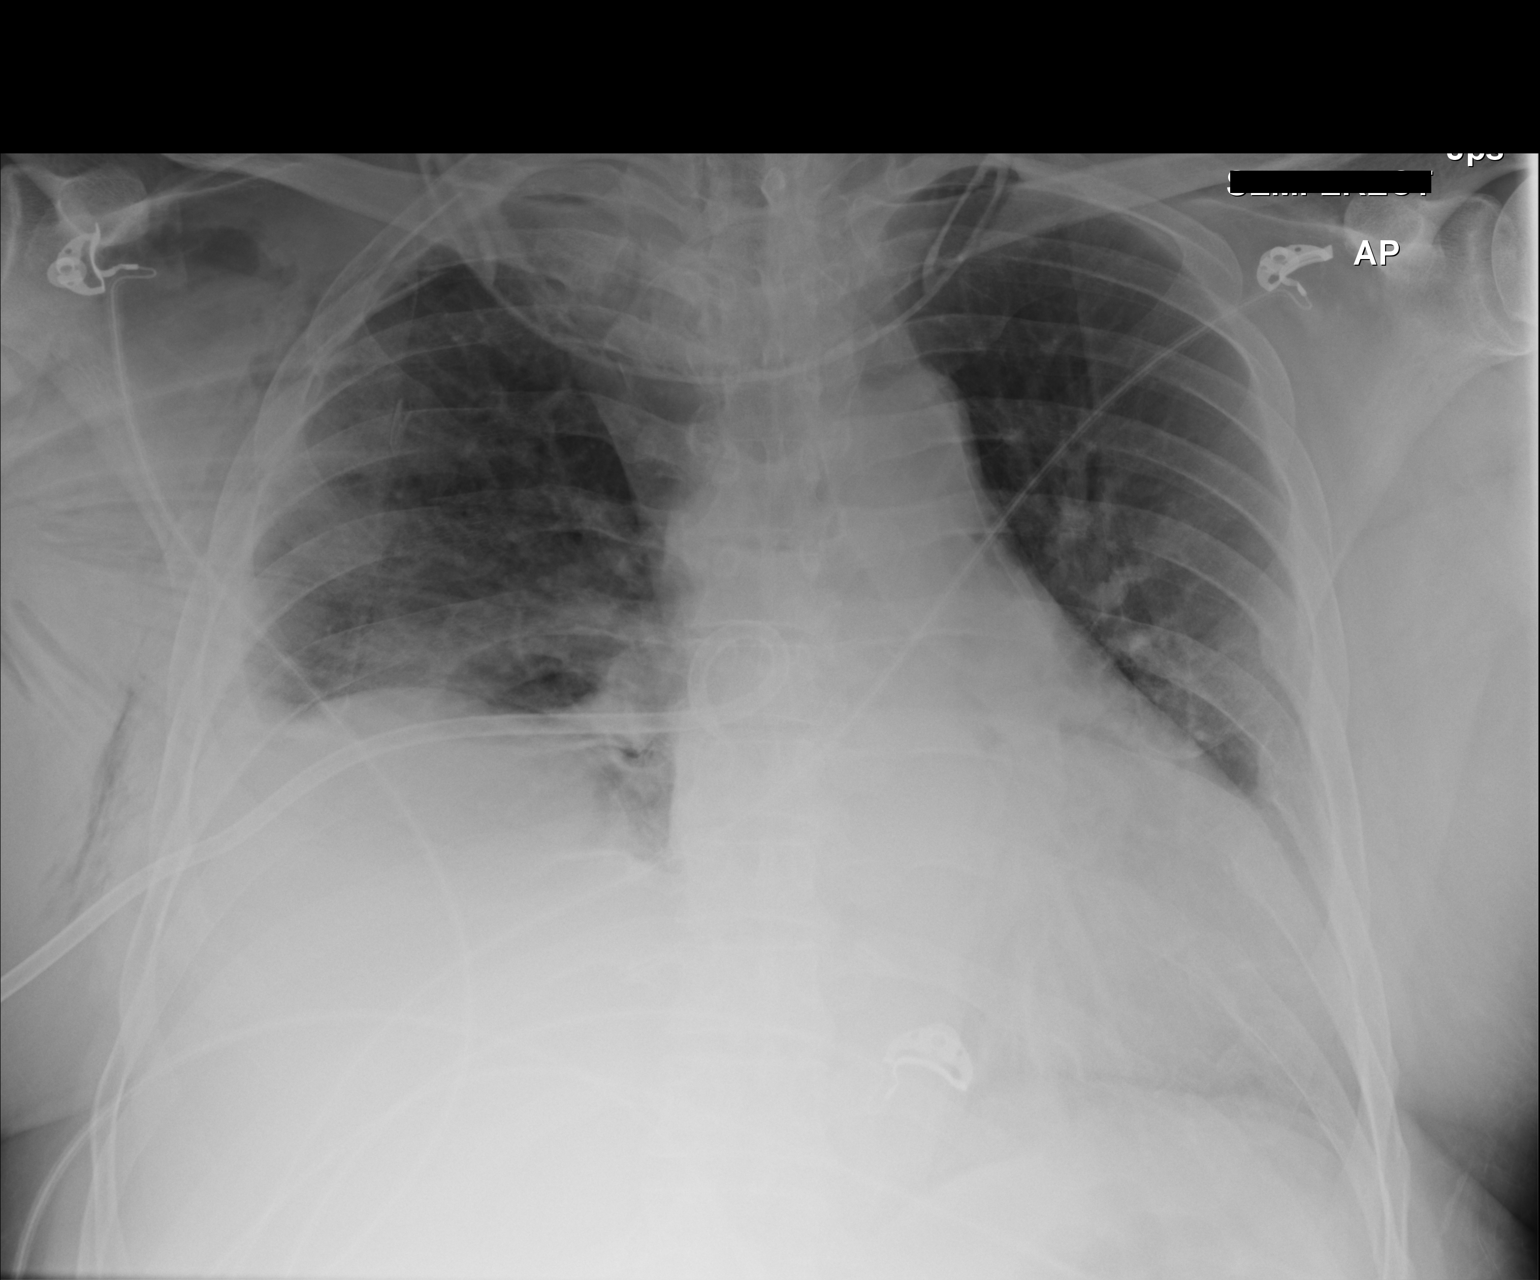

[1 of 1 positions shown; findings below may reference images not displayed]

FINDINGS: Stable cardiomegaly, vascular congestion and basilar atelectasis.
Right hemidiaphragm remains elevated.

Stable right pigtail chest tube. Suspect small residual basilar
pneumothorax over the hemidiaphragm. Trace developing right pleural
effusion. Similar right chest and axillary subcutaneous emphysema.
Trachea midline.
IMPRESSION: Multiple posterior right rib fractures. Suspect small residual right
basilar hydropneumothorax.

Stable lung volumes and aeration.

## 2024-01-22 NOTE — Progress Notes (Signed)
 Annual Complete Physical Exam  Subjective:  Chief Complaint  Patient presents with  . Medicare Annual Wellness Visit Subsequent    Last MWV was completed on 7Aug24.   . Annual Exam    Pt states he is fasting and doesn't want a skin check.      Ian Watson is a 73 y.o. male who presents today for annual physical exam. He is feeling well today without complaints. General health since the last visit is described as good. Will have median nerve decompression with Dr. Anitra in the coming months.   Colorectal cancer screening includes  colonoscopy within the last 10 years.   Prostate cancer screening: personal history of PC. last PSA checked 04/2023.    Health Maintenance Status       Date Due Completion Dates   Hepatitis C Screening Never done ---   ZOSTER VACCINE (1 of 2) Never done ---   Adult RSV (60+ Years or Pregnancy) (1 - Risk 60-74 years 1-dose series) Never done ---   Abdominal Aortic Aneurysm (AAA) Screening Never done ---   Influenza Vaccine (1) 01/12/2024 05/01/2023, 08/04/2021 (Declined)   Comment on 08/04/2021: See Legacy System   COVID-19 Vaccine (3 - 2024-25 season) 10/29/2024 (Originally 02/12/2023) 05/28/2021, 06/15/2020   Diabetes Screening 10/29/2024 10/30/2023, 01/18/2023   Comprehensive Annual Visit 01/21/2025 01/22/2024, 01/22/2024   Medicare Annual Wellness (AWV) Subsequent Visits 01/21/2025 01/22/2024, 01/18/2023   Depression Screening 01/21/2025 01/22/2024   DTaP/Tdap/Td Vaccines (2 - Td or Tdap) 01/26/2027 01/25/2017   Colorectal Cancer Screening 03/31/2032 03/31/2022, 03/31/2022       Immunizations needed: shingrix, flu, rsv Immunizations by Immunization Family     Covid-19 Vaccine Unspecified 06/15/2020 (73 y.o.)      Influenza, high-dose, trivalent, PF 02/27/2019 (73 y.o.) 05/01/2023 (72 y.o.)     Pfizer SARS-CoV-2 Bivalent 12+ yrs 05/28/2021 (73 y.o.)      Pneumococcal Conjugate 13-Valent 07/13/2015 (73 y.o.) 02/27/2019 (73 y.o.)     Pneumococcal Conjugate  Vaccine 20-Valent (PREVNAR-20) 6 wks+ 10/30/2023 (72 y.o.)      TDAP VACCINE (BOOSTRIX ,ADACEL) 7Y+ 01/25/2017 (73 y.o.)           Patient Active Problem List   Diagnosis Date Noted  . History of prostate cancer 01/22/2024  . Chronic gout of foot 01/22/2024  . Carpal tunnel syndrome, bilateral 01/18/2024  . Ulnar neuropathy at elbow, right 01/18/2024  . Cervical spondylosis 01/18/2024  . History of subarachnoid hemorrhage 08/04/2021  . Diplopia 03/04/2019  . Nuclear sclerotic cataract of both eyes 03/04/2019  . Cortical age-related cataract of both eyes 03/04/2019  . Posterior subcapsular age-related cataract of left eye 03/04/2019  . Prediabetes 03/04/2019  . Dermatochalasis of both upper eyelids 03/04/2019  . Blepharitis of upper and lower eyelids of both eyes 03/04/2019  . Meibomian gland dysfunction (MGD) of both eyes 03/04/2019  . Melanosis 03/04/2019  . Arcus senilis of both corneas 03/04/2019  . Chronic deep vein thrombosis (DVT) (HCC) 01/24/2019  . Hyponatremia 01/24/2019  . History of pelvic fracture 12/21/2018  . TBI (traumatic brain injury) (HCC) 12/21/2018  . Blood in stool 05/17/2018  . Localized osteoarthritis of right hand 11/11/2016  . 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 Effusion of right knee 08/25/2015  . Primary osteoarthritis of right knee 08/25/2015  . 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 allopurinoL (ZYLOPRIM) 100 mg, oral, Daily  . gabapentin  (NEURONTIN ) 400 mg, oral, 3 times daily  . indomethacin  (INDOCIN ) 25 mg  capsule Take one tablet three times daily as needed for gout flare. Take until gout resolves.     Allergies[1]  Family History[2]      Review of Systems - complete ROS negative except for those notedin the HPI.  Objective: BP 132/71   Pulse 78   Temp 98.2 F (36.8 C) (Temporal)   Ht 1.88 m (6' 2)   Wt 112 kg (248 lb)   SpO2 96%   BMI 31.84 kg/m   Physical  Exam Physical Exam Constitutional:      General: He is not in acute distress.    Appearance: Normal appearance. He is not toxic-appearing.  HENT:     Head: Normocephalic and atraumatic.     Right Ear: Tympanic membrane, ear canal and external ear normal.     Left Ear: Tympanic membrane, ear canal and external ear normal.     Nose: Nose normal.     Mouth/Throat:     Mouth: Mucous membranes are moist.   Eyes:     Extraocular Movements: Extraocular movements intact.     Conjunctiva/sclera: Conjunctivae normal.     Pupils: Pupils are equal, round, and reactive to light.   Neck:     Thyroid: No thyroid mass or thyromegaly.     Vascular: No carotid bruit.   Cardiovascular:     Rate and Rhythm: Normal rate and regular rhythm.     Pulses: Normal pulses.     Heart sounds: No murmur heard.    No friction rub. No gallop.  Pulmonary:     Effort: Pulmonary effort is normal.     Breath sounds: Normal breath sounds. No wheezing, rhonchi or rales.  Abdominal:     General: Bowel sounds are normal. There is no distension.     Palpations: Abdomen is soft. There is no mass.     Tenderness: There is no abdominal tenderness.     Hernia: No hernia is present.   Musculoskeletal:        General: No swelling or deformity. Normal range of motion.     Cervical back: Normal range of motion and neck supple.     Right lower leg: No edema.     Left lower leg: No edema.  Lymphadenopathy:     Cervical: No cervical adenopathy.   Skin:    General: Skin is warm and dry.     Capillary Refill: Capillary refill takes less than 2 seconds.     Findings: No rash.   Neurological:     General: No focal deficit present.     Mental Status: He is alert and oriented to person, place, and time. Mental status is at baseline.     Sensory: No sensory deficit.     Motor: No weakness.     Gait: Gait normal.   Psychiatric:        Mood and Affect: Mood normal.        Behavior: Behavior normal.        Thought  Content: Thought content normal.       Labs reviewed: Lab Results  Component Value Date   WBC 4.40 10/30/2023   HGB 13.1 (L) 10/30/2023   HCT 39.4 (L) 10/30/2023   PLT 176 10/30/2023   CHOL 197 01/18/2023   TRIG 141 01/18/2023   HDL 49 (L) 01/18/2023   ALT 17 01/18/2023   AST 22 01/18/2023   NA 137 01/18/2023   K 4.4 01/18/2023   CL 104 01/18/2023   CREATININE 0.86 01/18/2023  BUN 14 01/18/2023   EGFR >90 01/18/2023   CO2 27 01/18/2023   TSH 0.881 01/18/2023   PSA 0.02 05/01/2023   HGBA1C 6.1 (H) 10/30/2023    Immunization History  Administered Date(s) Administered  . Influenza, high-dose, trivalent, PF 02/27/2019, 05/01/2023  . Pfizer SARS-CoV-2 Bivalent 12+ yrs 05/28/2021  . Pfizer SARS-CoV-2 Primary Series 12+ yrs 06/15/2020  . Pneumococcal Conjugate 13-Valent 07/13/2015, 02/27/2019  . Pneumococcal Conjugate Vaccine 20-Valent (PREVNAR-20) 6 wks+ 10/30/2023  . TDAP VACCINE (BOOSTRIX ,ADACEL) 7Y+ 01/25/2017      Assessment/Plan   1. Encounter for annual physical exam (Primary) Normal exam. Health maintenance updated when appropriate. Will get below fasting labs. Provided information on 4 principles of healthy living (Do not smoke; BMI<30; 150 minutes of exercise per week; at least 5 servings of fruits or vegetables daily).    2. History of prostate cancer - PSA, Total (Diagnostic) With Reflex to PSA, Free if Indicated; Future  3. Prediabetes Update.  - Comprehensive Metabolic Panel; Future - Hemoglobin A1C With Estimated Average Glucose; Future  4. Chronic anemia - CBC with Differential; Future  5. Screening cholesterol level - Lipid Panel; Future  6. Screening for thyroid disorder - TSH With Reflex To Free T4; Future  7. Carpal tunnel syndrome, bilateral Surgical decompression of right wrist given severity.   8. Chronic gout of foot, unspecified cause, unspecified laterality Stable without recent flare.   9. Encounter for Medicare annual wellness  exam See below    Orders Placed This Encounter  Procedures  . PSA, Total (Diagnostic) With Reflex to PSA, Free if Indicated  . CBC with Differential  . Comprehensive Metabolic Panel  . Lipid Panel  . TSH With Reflex To Free T4  . Hemoglobin A1C With Estimated Average Glucose    Future Appointments  Date Time Provider Department Center  02/01/2024  8:20 AM Tinnie Norris Evangelina [facility] [address]  02/28/2024 10:00 AM Lynwood Charlie Curly, MD [facility] ORT PRE [facility]    I agree the documentation is accurate and complete.  I agree the documentation is accurate and complete.  Electronically signed by: Tinnie Norris Evangelina, PA-C 01/22/2024 11:19 AM    [facility]  - [facility] [place] Medicare Wellness Visit Type:: Subsequent Annual Wellness Visit  Name: Ian Watson Date of Birth: 03/03/1951 Age: 73 y.o. MRN: [id number] Visit Date: 01/22/2024  History obtained from: patient  Living Arrangements/Support System/Health Assessment/Pain/Stress Marital status: married Number of children: 3 Occupation: RETIRED FURNITURE WORK Living arrangements: lives with spouse/significant other Does the patient have a support system (family, friend, church, Conservation officer, nature, etc)?: Yes   Do you have any dental concerns?: No In the past month, have you experienced a change in your bladder control?: No   Do you have any difficulty obtaining your medications?: No   Do you have trouble consistently taking or remembering to take all of your medications as prescribed?: No Patient rates overall stress level as: None Does stress affect daily life?: No Typical amount of pain: some Does pain affect daily life?: No Are you currently prescribed opioids?: No                Depression Screening  Behavioral Health Screening  Patient Health Questionnaire-2 Score: 0 (01/22/2024 10:47 AM)      Patient's Depression screening/score = Negative     Depression Plan: Normal/Negative Screening    Social History (Tobacco/Drugs/Sexual Activity) Kirill reports that he has quit smoking. He has never used smokeless  tobacco. Tobacco Use?: No How many times in the past year have you used a recreational drug or used a prescription medication for nonmedical reasons?: None Risk factors for sexually transmitted infections (i.e., multiple sexual partners): No Are you bothered by sexual problems?: (!) Yes (PROSTATE CANCER)  Alcohol Screening How often do you have a drink containing alcohol?: Never How many standard drinks containing alcohol do you have on a typical day?: Never, 1 or 2 drinks How often do you have six or more drinks on one occasion?: Never Audit-C Score: 0  Physical Activity Regular exercise?: Yes Exercise frequency (times per week): 7 Exercise intensity: moderate (like brisk walking) (CUTS PEOPLE'S YARDS)  Diet How many meals a day?: 2 Eats fruit and vegetables daily?: Yes Most meals are obtained by: preparing their own meals, eating out, having others provide food  Home and Transportation Safety All rugs have non-skid backing?: N/A, no rugs All stairs or steps have railings?: N/A, no stairs Grab bars in the bathtub or shower?: Yes Have non-skid surface in bathtub or shower?: Yes Good home lighting?: Yes Regular seat belt use?: Yes      Activities of Daily Living Feed self?: Yes Bathe self?: Yes Dress self?: Yes Use toilet without assistance?: Yes Walk without assistance?: Yes    Instrumental Activities of Daily Living Manage finances?: Yes Shop for themselves?: Yes Prepare meals?: Yes Use the telephone?: Yes Manage medications?: Yes   Performs basic housework/laundry?: Yes Drives?: No Primary transportation is: family or friends  Hearing Concerns about hearing?: No Uses hearing aids?: No Hear whispered voice? (Observed): Yes  Vision Concerns about vision?: No Vision exam performed?: (!)  No  Fall Risk Is the patient ambulatory?: Yes One or more falls in the last year:: No Feels unsteady when walking:: No  Cognitive Assessment Has a diagnosis of dementia or cognitive impairment?: No Are there any memory concerns by the patient, others, or providers?: No              Advance Directives Living will?: Yes Advance directive information provided to patient: Yes Healthcare POA?: (!) No       Who is your in case of emergency contact?: CHRISTINE Mcraney Relationship to patient: Adult child Emergency contact's phone number: [phone]   Other History I reviewed and updated the following risk factors and conditions as appropriate: Reviewed/Updated: Problem List, Medical History, Surgical History, Family History, Medications, Allergies Reviewed/Updated: Vital Signs (height, weight, and BP), Immunizations, Health Maintenance Patient Care Team Updated: Done  Vital Signs BP 132/71   Pulse 78   Temp 98.2 F (36.8 C) (Temporal)   Ht 1.88 m (6' 2)   Wt 112 kg (248 lb)   SpO2 96%   BMI 31.84 kg/m   Screening and Immunizations Health Maintenance Status       Date Due Completion Dates   Hepatitis C Screening Never done ---   ZOSTER VACCINE (1 of 2) Never done ---   Adult RSV (60+ Years or Pregnancy) (1 - Risk 60-74 years 1-dose series) Never done ---   Abdominal Aortic Aneurysm (AAA) Screening Never done ---   Influenza Vaccine (1) 01/12/2024 05/01/2023, 08/04/2021 (Declined)   Comment on 08/04/2021: See Legacy System   COVID-19 Vaccine (3 - 2024-25 season) 10/29/2024 (Originally 02/12/2023) 05/28/2021, 06/15/2020   Diabetes Screening 10/29/2024 10/30/2023, 01/18/2023   Comprehensive Annual Visit 01/21/2025 01/22/2024, 01/22/2024   Medicare Annual Wellness (AWV) Subsequent Visits 01/21/2025 01/22/2024, 01/18/2023   Depression Screening 01/21/2025 01/22/2024   DTaP/Tdap/Td Vaccines (2 - Td  or Tdap) 01/26/2027 01/25/2017   Colorectal Cancer Screening 03/31/2032 03/31/2022,  03/31/2022       Immunization History  Administered Date(s) Administered  . Influenza, high-dose, trivalent, PF 02/27/2019, 05/01/2023  . Pfizer SARS-CoV-2 Bivalent 12+ yrs 05/28/2021  . Pfizer SARS-CoV-2 Primary Series 12+ yrs 06/15/2020  . Pneumococcal Conjugate 13-Valent 07/13/2015, 02/27/2019  . Pneumococcal Conjugate Vaccine 20-Valent (PREVNAR-20) 6 wks+ 10/30/2023  . TDAP VACCINE (BOOSTRIX ,ADACEL) 7Y+ 01/25/2017    Assessment/Plan: Subsequent Annual Wellness Visit: The topics above were reviewed with the patient.  Healthy lifestyle principles reviewed.  Recommendations provided when indicated.  Follow up 1 year for next wellness visit.  Orders Placed This Encounter  Procedures  . PSA, Total (Diagnostic) With Reflex to PSA, Free if Indicated  . CBC with Differential  . Comprehensive Metabolic Panel  . Lipid Panel  . TSH With Reflex To Free T4  . Hemoglobin A1C With Estimated Average Glucose    No orders of the defined types were placed in this encounter.   Patient Care Team: Tinnie Almarie Forts, PA-C as PCP - General (Physician Assistant) Tinnie Almarie Forts, PA-C as PCP - Attributed  Electronically signed by: Tinnie Almarie Forts, PA-C 01/22/2024 11:20 AM       [1] No Known Allergies [2] Family History Problem Relation Name Age of Onset  . Diabetes Mother    . Hypertension Mother

## 2024-01-29 ENCOUNTER — Emergency Department (HOSPITAL_BASED_OUTPATIENT_CLINIC_OR_DEPARTMENT_OTHER): Admission: EM | Admit: 2024-01-29 | Discharge: 2024-01-29 | Disposition: A | Discharge status: Home / Self Care

## 2024-01-29 ENCOUNTER — Encounter (HOSPITAL_BASED_OUTPATIENT_CLINIC_OR_DEPARTMENT_OTHER): Payer: Self-pay | Admitting: Emergency Medicine

## 2024-01-29 ENCOUNTER — Other Ambulatory Visit: Payer: Self-pay

## 2024-01-29 DIAGNOSIS — M5431 Sciatica, right side: Secondary | ICD-10-CM

## 2024-01-29 DIAGNOSIS — M5441 Lumbago with sciatica, right side: Secondary | ICD-10-CM | POA: Diagnosis not present

## 2024-01-29 DIAGNOSIS — Z8546 Personal history of malignant neoplasm of prostate: Secondary | ICD-10-CM | POA: Diagnosis not present

## 2024-01-29 DIAGNOSIS — M545 Low back pain, unspecified: Secondary | ICD-10-CM | POA: Diagnosis present

## 2024-01-29 MED ORDER — OXYCODONE-ACETAMINOPHEN 5-325 MG PO TABS: 1.0000 | ORAL_TABLET | Freq: Three times a day (TID) | ORAL | 0 refills | Status: AC | PRN

## 2024-01-29 MED ORDER — PREDNISONE 10 MG (21) PO TBPK: ORAL_TABLET | Freq: Every day | ORAL | 0 refills | Status: DC

## 2024-01-29 MED ORDER — OXYCODONE-ACETAMINOPHEN 5-325 MG PO TABS
1.0000 | ORAL_TABLET | Freq: Once | ORAL | Status: AC
  Administered 2024-01-29: 1 via ORAL
  Filled 2024-01-29: qty 1

## 2024-01-29 MED ORDER — MELOXICAM 7.5 MG PO TABS: 7.5000 mg | ORAL_TABLET | Freq: Every day | ORAL | 0 refills | Status: AC

## 2024-01-29 MED ORDER — LIDOCAINE 5 % EX PTCH: 1.0000 | MEDICATED_PATCH | CUTANEOUS | 0 refills | Status: AC

## 2024-01-29 NOTE — Discharge Instructions (Addendum)
 Thank you for letting us  evaluate you today.  It sounds that you are having some sciatica flares.  I have sent meloxicam  (strong ibuprofen ), steroid burst, lidocaine  patch to your pharmacy to start taking today to control pain, inflammation.  You may put the patch in areas of pain.  I have also sent a small course of very strong narcotic pain medicine (Percocet) that you may use as needed for pain that is not controlled with meloxicam .  Do not take Tylenol  with Percocet as there is Tylenol  in the Percocet.  Please make sure to do sciatica stretches as these help control flares  As discussed please return to Emergency Department if you experience numbness or weakness in one-sided body, urinary or fecal incontinence, loss of sensation to genital region, difficulty urinating, burning with urination, urinary retention, significant worsening symptoms

## 2024-01-29 NOTE — ED Provider Notes (Signed)
 [place] [facility] Provider Note   CSN: [id number] Arrival date & time: 01/29/24  1337     Patient presents with: Back Pain   Ian Watson is a 73 y.o. male with past medical history of prostate cancer (in remission), anemia, gout, DVT (no AC currently), [facility] presents emergency department for evaluation of intermittent burning pain that starts in right buttocks and goes down into the right posterior thigh that started last Thursday.  Worsens with ambulation, WB and reports that he has these flares every 2 hours.  Has been taking gabapentin  twice daily as prescribed for nerve pain in hands bilaterally secondary to known carpal tunnel without relief to right leg pain.  Currently does not have any pain.  Has a history of prostate cancer however this is in remission.  Denies IVDU, urinary symptoms, urinary incontinence, saddle paresthesia, fevers, paresthesia or weakness in legs.  {Add pertinent medical, surgical, social history, OB history to HPI:[id number]}  Back Pain      Prior to Admission medications   Medication Sig Start Date End Date Taking? Authorizing Provider  acetaminophen  (TYLENOL ) 325 MG tablet Take 2 tablets (650 mg total) by mouth every 4 (four) hours as needed for mild pain. 12/31/18   Angiulli, Toribio PARAS, PA-C  docusate sodium  (COLACE) 100 MG capsule Take 1 capsule (100 mg total) by mouth 2 (two) times daily. 12/31/18   Angiulli, Toribio PARAS, PA-C  gabapentin  (NEURONTIN ) 600 MG tablet Take 0.5 tablets (300 mg total) by mouth 3 (three) times daily. 12/31/18   Angiulli, Toribio PARAS, PA-C  indomethacin  (INDOCIN ) 25 MG capsule Take 1 capsule (25 mg total) by mouth 3 (three) times daily as needed. 01/05/20   Beverley Leita LABOR, PA-C  methocarbamol  (ROBAXIN ) 500 MG tablet Take 2 tablets (1,000 mg total) by mouth 3 (three) times daily. 12/31/18   Angiulli, Toribio PARAS, PA-C  oxyCODONE  (OXY IR/ROXICODONE ) 5 MG immediate release tablet Take 1-2 tablets (5-10 mg total)  by mouth every 4 (four) hours as needed for moderate pain or severe pain. 12/31/18   Angiulli, Toribio PARAS, PA-C  polyethylene glycol (MIRALAX  / GLYCOLAX ) 17 g packet Take 17 g by mouth 2 (two) times daily. 12/31/18   Angiulli, Toribio PARAS, PA-C    Allergies: Patient has no known allergies.    Review of Systems  Musculoskeletal:  Positive for back pain.    Updated Vital Signs BP (!) 156/76 (BP Location: Left Arm)   Pulse 68   Temp 97.7 F (36.5 C) (Oral)   Resp 14   Ht 6' 2 (1.88 m)   Wt 113.4 kg   SpO2 98%   BMI 32.10 kg/m   Physical Exam Vitals and nursing note reviewed.  Constitutional:      General: He is not in acute distress.    Appearance: Normal appearance.  HENT:     Head: Normocephalic and atraumatic.  Eyes:     Conjunctiva/sclera: Conjunctivae normal.  Cardiovascular:     Rate and Rhythm: Normal rate.     Pulses:          Dorsalis pedis pulses are 2+ on the right side and 2+ on the left side.  Pulmonary:     Effort: Pulmonary effort is normal. No respiratory distress.  Musculoskeletal:     Cervical back: No bony tenderness. No pain with movement.     Thoracic back: No bony tenderness.     Lumbar back: No tenderness or bony tenderness. Normal range of motion. Negative right  straight leg raise test and negative left straight leg raise test.     Right lower leg: No edema.     Left lower leg: No edema.     Comments: No swelling nor tenderness to legs bilaterally.  Toula' sign negative x 2.  SLR negative bilaterally with no lumbar nor paraspinous musculature tenderness on exam  Skin:    Coloration: Skin is not jaundiced or pale.     Comments: No signs of cellulitis nor skin breakdown to BLE  Neurological:     Mental Status: He is alert. Mental status is at baseline.     Comments: Motor 5/5 and sensation 2/2 BLE.  Able to ambulate without difficulty nor limp     (all labs ordered are listed, but only abnormal results are displayed) Labs Reviewed - No data to  display  EKG: None  Radiology: No results found.  Medications Ordered in the ED - No data to display    {Click here for ABCD2, HEART and other calculators REFRESH Note before signing:1}                              Medical Decision Making Risk Prescription drug management.   Patient presents to the ED for concern of right leg pain, back pain, this involves an extensive number of treatment options, and is a complaint that carries with it a high risk of complications and morbidity.  The differential diagnosis includes lumbar radiculopathy, cauda equina, DVT, cellulitis, infection, ischemic limb, neuropathy   Co morbidities that complicate the patient evaluation  History of prostate cancer, known paresthesia in hands, DVT   Additional history obtained:  Additional history obtained from Nursing and Outside Medical Records   External records from outside source obtained and reviewed including triage note, most recent PCP note from 01/22/2024 and lab work     Medicines ordered and prescription drug management:  I ordered medication including prednisone , Percocet, meloxicam , lidocaine  patches for pain I have reviewed the patients home medicines and have made adjustments as needed    Problem List / ED Course:  Sciatica Symptoms sound synonymous with sciatica flares.  Currently, has no pain whatsoever.  Neurologically intact.  Unable to reproduce tenderness on exam while in ED.  Reports that symptoms have been intermittent typically occurring every 2 hours since last Thursday Considered DVT as patient has a history of DVT fortunately no swelling nor tenderness to BLE.  Low suspicion for DVT Low suspicion for ischemic limb as limb is well-perfused with easily palpable pulses.  DP 2+ bilaterally Low suspicion for infection with no skin breakdown, streaking, erythema, nor warmth bilaterally Low suspicion for cauda equina with no complaints of urinary symptoms, urinary  incontinence, nor saddle paresthesia Low suspicion for spinal epidural abscess as he has no mass nor tenderness along spine.  No history of IVDU nor fevers Low suspicion for UTI, pyelonephritis as patient has absolutely no urinary symptoms nor fever With very reassuring physical exam and inability to reproduce pain, I do not feel that lab work, UA, imaging is necessary at this time.  Will treat for suspected sciatica flare.  I discussed this with patient and patient's wife at bedside who expressed understanding and agreed with current plan Will provide patient with meloxicam , steroid burst, lidocaine  patches, stretches for suspected sciatica.  I also did provide him with Percocet in case he has pain that is not controlled extensively discussed strict return precautions to include signs  of cauda equina, worsening symptoms   Reevaluation:  After the interventions noted above, I reevaluated the patient and found that they have :stayed the same   Social Determinants of Health:  Has PCP follow-up   Dispostion:  After consideration of the diagnostic results and the patients response to treatment, I feel that the patent would benefit from outpatient management symptomatic care.   Discussed ED workup, disposition, return to ED precautions with patient who expresses understanding agrees with plan.  All questions answered to their satisfaction.  They are agreeable to plan.  Discharge instructions provided on paperwork  Final diagnoses:  None    ED Discharge Orders     None

## 2024-01-29 NOTE — ED Triage Notes (Signed)
 States rt back pain that radiates down leg started last Thursday hurts to walk

## 2024-01-29 NOTE — ED Notes (Signed)
 Reviewed discharge instructions and medications. Pt requesting pain medication prior to discharge. Verbal order given for percocet by Minnie PA. Pt states understanding to instructions. Questions answered. Ambulatory at discharge with wife whom is driving home

## 2024-06-09 ENCOUNTER — Emergency Department (HOSPITAL_BASED_OUTPATIENT_CLINIC_OR_DEPARTMENT_OTHER)
Admission: EM | Admit: 2024-06-09 | Discharge: 2024-06-09 | Disposition: A | Discharge status: Home / Self Care | Attending: Emergency Medicine | Admitting: Emergency Medicine

## 2024-06-09 ENCOUNTER — Encounter (HOSPITAL_BASED_OUTPATIENT_CLINIC_OR_DEPARTMENT_OTHER): Payer: Self-pay | Admitting: Emergency Medicine

## 2024-06-09 DIAGNOSIS — G5603 Carpal tunnel syndrome, bilateral upper limbs: Secondary | ICD-10-CM | POA: Diagnosis not present

## 2024-06-09 DIAGNOSIS — R202 Paresthesia of skin: Secondary | ICD-10-CM | POA: Diagnosis present

## 2024-06-09 LAB — CBC WITH DIFFERENTIAL/PLATELET
Abs Immature Granulocytes: 0.02 K/uL (ref 0.00–0.07)
Basophils Absolute: 0 K/uL (ref 0.0–0.1)
Basophils Relative: 1 %
Eosinophils Absolute: 0.1 K/uL (ref 0.0–0.5)
Eosinophils Relative: 1 %
HCT: 40.3 % (ref 39.0–52.0)
Hemoglobin: 13.4 g/dL (ref 13.0–17.0)
Immature Granulocytes: 0 %
Lymphocytes Relative: 28 %
Lymphs Abs: 1.8 K/uL (ref 0.7–4.0)
MCH: 31.2 pg (ref 26.0–34.0)
MCHC: 33.3 g/dL (ref 30.0–36.0)
MCV: 93.9 fL (ref 80.0–100.0)
Monocytes Absolute: 0.5 K/uL (ref 0.1–1.0)
Monocytes Relative: 8 %
Neutro Abs: 4 K/uL (ref 1.7–7.7)
Neutrophils Relative %: 62 %
Platelets: 229 K/uL (ref 150–400)
RBC: 4.29 MIL/uL (ref 4.22–5.81)
RDW: 13.6 % (ref 11.5–15.5)
WBC: 6.5 K/uL (ref 4.0–10.5)
nRBC: 0 % (ref 0.0–0.2)

## 2024-06-09 LAB — COMPREHENSIVE METABOLIC PANEL WITH GFR
ALT: 16 U/L (ref 0–44)
AST: 28 U/L (ref 15–41)
Albumin: 4.4 g/dL (ref 3.5–5.0)
Alkaline Phosphatase: 86 U/L (ref 38–126)
Anion gap: 11 (ref 5–15)
BUN: 16 mg/dL (ref 8–23)
CO2: 25 mmol/L (ref 22–32)
Calcium: 8.8 mg/dL — ABNORMAL LOW (ref 8.9–10.3)
Chloride: 103 mmol/L (ref 98–111)
Creatinine, Ser: 0.73 mg/dL (ref 0.61–1.24)
GFR, Estimated: >60 mL/min
Glucose, Bld: 97 mg/dL (ref 70–99)
Potassium: 4.6 mmol/L (ref 3.5–5.1)
Sodium: 138 mmol/L (ref 135–145)
Total Bilirubin: 0.4 mg/dL (ref 0.0–1.2)
Total Protein: 8 g/dL (ref 6.5–8.1)

## 2024-06-09 MED ORDER — PREDNISONE 20 MG PO TABS: 40.0000 mg | ORAL_TABLET | Freq: Every day | ORAL | 0 refills | Status: AC

## 2024-06-09 MED ORDER — PREDNISONE 50 MG PO TABS
60.0000 mg | ORAL_TABLET | Freq: Once | ORAL | Status: AC
  Administered 2024-06-09: 60 mg via ORAL
  Filled 2024-06-09: qty 1

## 2024-06-09 NOTE — Discharge Instructions (Signed)
 I have sent prednisone  into the pharmacy for you.  Follow-up with your orthopedic surgeon.  Return for any concerning symptoms.
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
